# Patient Record
Sex: Female | Born: 1950 | Race: White | Hispanic: No | Marital: Married | Smoking: Never smoker
Health system: Southern US, Community
[De-identification: ages and names within clinical notes are randomized; demographics above are authoritative.]

## PROBLEM LIST (undated history)

## (undated) DIAGNOSIS — K5792 Diverticulitis of intestine, part unspecified, without perforation or abscess without bleeding: Secondary | ICD-10-CM

## (undated) DIAGNOSIS — M1990 Unspecified osteoarthritis, unspecified site: Secondary | ICD-10-CM

## (undated) DIAGNOSIS — F411 Generalized anxiety disorder: Secondary | ICD-10-CM

## (undated) DIAGNOSIS — D3611 Benign neoplasm of peripheral nerves and autonomic nervous system of face, head, and neck: Secondary | ICD-10-CM

## (undated) DIAGNOSIS — R51 Headache: Secondary | ICD-10-CM

## (undated) DIAGNOSIS — B019 Varicella without complication: Secondary | ICD-10-CM

## (undated) DIAGNOSIS — E785 Hyperlipidemia, unspecified: Secondary | ICD-10-CM

## (undated) DIAGNOSIS — M8580 Other specified disorders of bone density and structure, unspecified site: Secondary | ICD-10-CM

## (undated) DIAGNOSIS — I10 Essential (primary) hypertension: Secondary | ICD-10-CM

## (undated) DIAGNOSIS — L4050 Arthropathic psoriasis, unspecified: Secondary | ICD-10-CM

## (undated) HISTORY — DX: Other specified disorders of bone density and structure, unspecified site: M85.80

## (undated) HISTORY — DX: Arthropathic psoriasis, unspecified: L40.50

## (undated) HISTORY — PX: NEUROFIBROMA EXCISION: SHX2089

## (undated) HISTORY — DX: Essential (primary) hypertension: I10

## (undated) HISTORY — PX: OTHER SURGICAL HISTORY: SHX169

## (undated) HISTORY — DX: Headache: R51

## (undated) HISTORY — DX: Diverticulitis of intestine, part unspecified, without perforation or abscess without bleeding: K57.92

## (undated) HISTORY — DX: Benign neoplasm of peripheral nerves and autonomic nervous system of face, head, and neck: D36.11

## (undated) HISTORY — DX: Generalized anxiety disorder: F41.1

## (undated) HISTORY — DX: Unspecified osteoarthritis, unspecified site: M19.90

## (undated) HISTORY — DX: Varicella without complication: B01.9

## (undated) HISTORY — DX: Hyperlipidemia, unspecified: E78.5

---

## 1998-02-21 HISTORY — PX: BREAST BIOPSY: SHX20

## 2003-03-13 ENCOUNTER — Encounter (INDEPENDENT_AMBULATORY_CARE_PROVIDER_SITE_OTHER): Payer: Self-pay | Admitting: *Deleted

## 2003-03-13 ENCOUNTER — Encounter: Admission: RE | Admit: 2003-03-13 | Discharge: 2003-03-13 | Payer: Self-pay | Admitting: Surgery

## 2006-07-03 ENCOUNTER — Ambulatory Visit: Payer: Self-pay | Admitting: Internal Medicine

## 2006-07-14 ENCOUNTER — Ambulatory Visit: Payer: Self-pay | Admitting: Internal Medicine

## 2006-07-18 ENCOUNTER — Encounter: Payer: Self-pay | Admitting: Internal Medicine

## 2010-10-05 ENCOUNTER — Other Ambulatory Visit: Payer: Self-pay | Admitting: Dermatology

## 2011-11-10 LAB — HM DEXA SCAN

## 2011-12-08 ENCOUNTER — Other Ambulatory Visit: Payer: Self-pay | Admitting: Dermatology

## 2012-04-05 ENCOUNTER — Ambulatory Visit: Payer: Self-pay | Admitting: Family Medicine

## 2012-04-12 ENCOUNTER — Ambulatory Visit (INDEPENDENT_AMBULATORY_CARE_PROVIDER_SITE_OTHER): Payer: BC Managed Care – PPO | Admitting: Family Medicine

## 2012-04-12 ENCOUNTER — Encounter: Payer: Self-pay | Admitting: Family Medicine

## 2012-04-12 VITALS — BP 120/80 | HR 90 | Temp 98.3°F | Ht 66.0 in | Wt 173.0 lb

## 2012-04-12 DIAGNOSIS — M8580 Other specified disorders of bone density and structure, unspecified site: Secondary | ICD-10-CM

## 2012-04-12 DIAGNOSIS — Z7689 Persons encountering health services in other specified circumstances: Secondary | ICD-10-CM

## 2012-04-12 DIAGNOSIS — L4050 Arthropathic psoriasis, unspecified: Secondary | ICD-10-CM

## 2012-04-12 DIAGNOSIS — E785 Hyperlipidemia, unspecified: Secondary | ICD-10-CM

## 2012-04-12 DIAGNOSIS — F411 Generalized anxiety disorder: Secondary | ICD-10-CM

## 2012-04-12 DIAGNOSIS — I10 Essential (primary) hypertension: Secondary | ICD-10-CM

## 2012-04-12 DIAGNOSIS — Z7189 Other specified counseling: Secondary | ICD-10-CM

## 2012-04-12 DIAGNOSIS — M899 Disorder of bone, unspecified: Secondary | ICD-10-CM

## 2012-04-12 HISTORY — DX: Hyperlipidemia, unspecified: E78.5

## 2012-04-12 HISTORY — DX: Arthropathic psoriasis, unspecified: L40.50

## 2012-04-12 HISTORY — DX: Generalized anxiety disorder: F41.1

## 2012-04-12 HISTORY — DX: Essential (primary) hypertension: I10

## 2012-04-12 HISTORY — DX: Other specified disorders of bone density and structure, unspecified site: M85.80

## 2012-04-12 MED ORDER — SERTRALINE HCL 50 MG PO TABS: 50.0000 mg | ORAL_TABLET | Freq: Every day | ORAL | Status: DC

## 2012-04-12 NOTE — Progress Notes (Addendum)
Chief Complaint  Patient presents with  . Establish Care    HPI:  Cassidy Dickerson is here to establish care. Used to see [place] - she left private practice. Last PCP and physical:  Has the following chronic problems and concerns today:  Patient Active Problem List  Diagnosis  . Hyperlipemia  . Essential hypertension, benign  . Generalized anxiety disorder  . Psoriatic arthritis - managed by Dr. Dierdre Forth in Rheum  HLD: -reports was on simvastatin in the past -she stopped taking this accidentally - about 1 year ago -she wants to check labs and restart if needed  HTN: -thinks has been controlled with diet exercise  Palpitations: -very rare  -lasts a few seconds, usually with stress  GAD: -takes sertraline for this -stable, doing well -wants refill  Health Maintenance: -mammogram and dexa - both good per her report -sees Doctor Dierdre Forth for Psoriatic arthritis and Osteopenia and was on boniva for 5 years, but has been stopped and doing calcium and vitamin D, he checks labs -last pap was 3 years ago - normal, remote mild abnormalities with paps ROS: See pertinent positives and negatives per HPI.  Past Medical History  Diagnosis Date  . Hyperlipemia 04/12/2012  . Essential hypertension, benign 04/12/2012  . Generalized anxiety disorder 04/12/2012  . Psoriatic arthritis - managed by Dr. Dierdre Forth in Rheum 04/12/2012  . Osteopenia 04/12/2012  . Neurofibroma of neck     Family History  Problem Relation Age of Onset  . Heart disease Father 43    MI  . Cancer Maternal Grandmother     cervical ca    History   Social History  . Marital Status: Married    Spouse Name: N/A    Number of Children: N/A  . Years of Education: N/A   Social History Main Topics  . Smoking status: Never Smoker   . Smokeless tobacco: None  . Alcohol Use: Yes     Comment: SOCIALLY   . Drug Use: None  . Sexually Active: None   Other Topics Concern  . None   Social History  Narrative   Work or School: part Engineer, technical sales for [employer] Situation: lives with husband whom is dealing with health issues - ? parkinson's      Spiritual Beliefs: episcopalian, christian      Lifestyle:  No regular exercise, diet is decent                Current outpatient prescriptions:folic acid (FOLVITE) 1 MG tablet, Take 1 mg by mouth daily., Disp: , Rfl: ;  methotrexate (RHEUMATREX) 2.5 MG tablet, Take by mouth once a week. 4 tablets once per week, Disp: , Rfl: ;  sertraline (ZOLOFT) 50 MG tablet, Take 1 tablet (50 mg total) by mouth daily., Disp: 90 tablet, Rfl: 3  EXAM:  Filed Vitals:   04/12/12 0819  BP: 120/80  Pulse: 90  Temp: 98.3 F (36.8 C)    Body mass index is 27.94 kg/(m^2).  GENERAL: vitals reviewed and listed above, alert, oriented, appears well hydrated and in no acute distress  HEENT: atraumatic, conjunttiva clear, no obvious abnormalities on inspection of external nose and ears  NECK: no obvious masses on inspection  MS: moves all extremities without noticeable abnormality  PSYCH: pleasant and cooperative, no obvious depression or anxiety  ASSESSMENT AND PLAN:  Discussed the following assessment and plan:  Hyperlipemia - Plan: Lipid Panel  Essential hypertension, benign  Generalized anxiety disorder -  Plan: sertraline (ZOLOFT) 50 MG tablet  Psoriatic arthritis - managed by Dr. Dierdre Forth in Rheum  Osteopenia  Encounter to establish care - Plan: Hemoglobin A1c -We reviewed the PMH, PSH, FH, SH, Meds and Allergies. -We provided refills for any medications we will prescribe as needed. -We addressed current concerns per orders and patient instructions. -Discussed the palpitations and offered heart monitor - she is not worried about this as occurs so infrequently and thinks is anxiety - she will journal for now -We have asked for records for pertinent exams, studies, vaccines and notes from previous providers. -We have  advised patient to follow up per instructions below.  -Patient advised to return or notify a doctor immediately if symptoms worsen or persist or new concerns arise.  Patient Instructions  -We have ordered labs or studies at this visit. It can take up to 1-2 weeks for results and processing. We will contact you with instructions IF your results are abnormal. Normal results will be released to your [facility]. If you have not heard from [place] or can not find your results in [facility] in 2 weeks please contact our office.  -PLEASE SIGN UP FOR MYCHART TODAY   We recommend the following healthy lifestyle measures: - eat a healthy diet consisting of lots of vegetables, fruits, beans, nuts, seeds, healthy meats such as white chicken and fish and whole grains.  - avoid fried foods, fast food, processed foods, sodas, red meet and other fattening foods.  - get a least 150 minutes of aerobic exercise per week.   Follow up in: schedule lab appointment for tomorrow and physical exam in next few months      KIM, HANNAH R.

## 2012-04-12 NOTE — Patient Instructions (Addendum)
-  We have ordered labs or studies at this visit. It can take up to 1-2 weeks for results and processing. We will contact you with instructions IF your results are abnormal. Normal results will be released to your [facility] If you have not heard from [place] or can not find your results in [facility] in 2 weeks please contact our office.  -PLEASE SIGN UP FOR MYCHART TODAY   We recommend the following healthy lifestyle measures: - eat a healthy diet consisting of lots of vegetables, fruits, beans, nuts, seeds, healthy meats such as white chicken and fish and whole grains.  - avoid fried foods, fast food, processed foods, sodas, red meet and other fattening foods.  - get a least 150 minutes of aerobic exercise per week.   Follow up in: schedule lab appointment for tomorrow and physical exam in next few months

## 2012-04-13 ENCOUNTER — Other Ambulatory Visit (INDEPENDENT_AMBULATORY_CARE_PROVIDER_SITE_OTHER): Payer: BC Managed Care – PPO

## 2012-04-13 DIAGNOSIS — E785 Hyperlipidemia, unspecified: Secondary | ICD-10-CM

## 2012-04-13 DIAGNOSIS — Z7189 Other specified counseling: Secondary | ICD-10-CM

## 2012-04-13 DIAGNOSIS — Z7689 Persons encountering health services in other specified circumstances: Secondary | ICD-10-CM

## 2012-04-13 LAB — LIPID PANEL
Cholesterol: 264 mg/dL — ABNORMAL HIGH (ref 0–200)
HDL: 41.9 mg/dL (ref >39.00)
Total CHOL/HDL Ratio: 6
Triglycerides: 113 mg/dL (ref 0.0–149.0)
VLDL: 22.6 mg/dL (ref 0.0–40.0)

## 2012-04-13 LAB — LDL CHOLESTEROL, DIRECT: Direct LDL: 182.8 mg/dL

## 2012-04-15 ENCOUNTER — Telehealth: Payer: Self-pay | Admitting: Family Medicine

## 2012-04-15 NOTE — Telephone Encounter (Signed)
Please let patient know, since not yet signed up for mychart, wanted to contact regarding lab results. Recommend signing up for mychart to see these results in about 10 days.  -cholesterol is high  The best treatment to hopefully reverse these findings and prevent adverse health outcomes is a healthy diet and regular exercise. I know she was on a statin in the past - if she prefers to restart this we can. Or she can work on diet and exercise and we can talk about it at her next appointment.

## 2012-04-16 NOTE — Telephone Encounter (Signed)
Left a message for pt to return call 

## 2012-04-18 ENCOUNTER — Encounter: Payer: Self-pay | Admitting: Family Medicine

## 2012-04-18 NOTE — Telephone Encounter (Signed)
Left a message for pt to return call 

## 2012-04-19 NOTE — Telephone Encounter (Signed)
Pt states she would like to try to work on diet and exercise first before starting back on statin drug.

## 2012-04-19 NOTE — Telephone Encounter (Signed)
Pt returned call; spoke with pt about lab results and pt is aware.

## 2012-04-19 NOTE — Telephone Encounter (Signed)
Left a message for pt to return call 

## 2012-04-19 NOTE — Telephone Encounter (Signed)
Letter mailed to pts home address.  

## 2012-05-01 ENCOUNTER — Encounter: Payer: Self-pay | Admitting: Family Medicine

## 2012-05-09 ENCOUNTER — Encounter: Payer: Self-pay | Admitting: Family Medicine

## 2012-05-09 NOTE — Progress Notes (Signed)
Received notes from Dr. Dierdre Forth. Scanned into chart. Had dexa 03-2011.

## 2012-05-14 ENCOUNTER — Encounter: Payer: Self-pay | Admitting: Family Medicine

## 2012-06-14 ENCOUNTER — Ambulatory Visit (INDEPENDENT_AMBULATORY_CARE_PROVIDER_SITE_OTHER): Payer: BC Managed Care – PPO | Admitting: Family Medicine

## 2012-06-14 ENCOUNTER — Encounter: Payer: Self-pay | Admitting: Family Medicine

## 2012-06-14 ENCOUNTER — Other Ambulatory Visit (HOSPITAL_COMMUNITY)
Admission: RE | Admit: 2012-06-14 | Discharge: 2012-06-14 | Disposition: A | Discharge status: Home / Self Care | Payer: BC Managed Care – PPO | Source: Ambulatory Visit | Attending: Family Medicine | Admitting: Family Medicine

## 2012-06-14 VITALS — BP 140/90 | Temp 98.3°F | Wt 173.0 lb

## 2012-06-14 DIAGNOSIS — I10 Essential (primary) hypertension: Secondary | ICD-10-CM

## 2012-06-14 DIAGNOSIS — Z23 Encounter for immunization: Secondary | ICD-10-CM

## 2012-06-14 DIAGNOSIS — Z0000 Encounter for general adult medical examination without abnormal findings: Secondary | ICD-10-CM

## 2012-06-14 DIAGNOSIS — E785 Hyperlipidemia, unspecified: Secondary | ICD-10-CM

## 2012-06-14 DIAGNOSIS — Z01419 Encounter for gynecological examination (general) (routine) without abnormal findings: Secondary | ICD-10-CM | POA: Insufficient documentation

## 2012-06-14 MED ORDER — PRAVASTATIN SODIUM 20 MG PO TABS: 20.0000 mg | ORAL_TABLET | Freq: Every day | ORAL | Status: DC

## 2012-06-14 NOTE — Patient Instructions (Signed)
-  We have ordered labs or studies at this visit (pap smear). It can take up to 1-2 weeks for results and processing. We will contact you with instructions IF your results are abnormal. Normal results will be released to your [facility]. If you have not heard from [place] or can not find your results in [facility] in 2 weeks please contact our office.  -PLEASE SIGN UP FOR MYCHART TODAY   We recommend the following healthy lifestyle measures: - eat a healthy diet consisting of lots of vegetables, fruits, beans, nuts, seeds, healthy meats such as white chicken and fish and whole grains.  - avoid fried foods, fast food, processed foods, sodas, red meet and other fattening foods.  - get a least 150 minutes of aerobic exercise per week.   -As we discussed, we have prescribed a new medication (PRAVASTAIN) for you at this appointment. We discussed the common and serious potential adverse effects of this medication and you can review these and more with the pharmacist when you pick up your medication.  Please follow the instructions for use carefully and notify us immediately if you have any problems taking this medication.  -schedule you mammogram for September  -Wear sunscreen  Follow up in: 3 months, morning appointment - come fasting

## 2012-06-14 NOTE — Addendum Note (Signed)
Addended by: Terressa Koyanagi on: 06/14/2012 11:52 AM   Modules accepted: Level of Service

## 2012-06-14 NOTE — Progress Notes (Addendum)
Chief Complaint  Patient presents with  . Annual Exam    HPI:  Here for CPE:  -Concerns today: none  -Diet: ok, trying to eat healthier  -Exercise: started walking 30-60 minutes 3-4 days per weeks  -Diabetes and Dyslipidemia Screening: checked 03/2012 - lipids high, advised restarting statin but she wanted to try lifestyle changes first. But, now she wants to start the statin as brother recently had heart issues.  -Hx of HTN: no  -Vaccines: she wants to do shingles vaccine today  -pap history: last pap 3 years ago normal - reports mild remote pap abnormalities > 8 years ago and normal on repeat  -FDLMP: postmenopausal  -sexual activity: yes, female partner, no new partners  -wants STI testing: no  -FH breast, colon or ovarian ca: see FH  -dexa followed by Dr. Dierdre Forth in Rheum - most recent in 2013, on boniva for 5 years, now on calcium and vit D -mammo 10/2011 birads 2 -colon cancer screening: 3 years ago per patient report  -Alcohol, Tobacco, drug use: see social history  Sees dermatology once yearly for skin checks  Review of Systems - denies: CP, SOB, fevers, weight changes, bowel changes, dysuria, vaginal discharge, hematochezia or melena, pain, rashes, dizziness, vision changes  Past Medical History  Diagnosis Date  . Hyperlipemia 04/12/2012  . Essential hypertension, benign 04/12/2012  . Generalized anxiety disorder 04/12/2012  . Psoriatic arthritis - managed by Dr. Dierdre Forth in Rheum 04/12/2012  . Osteopenia 04/12/2012  . Neurofibroma of neck   . Arthritis   . Chicken pox   . Headache     frequent    Family History  Problem Relation Age of Onset  . Heart disease Father 1    MI  . Cancer Maternal Grandmother     cervical ca  . Heart disease Brother 31    History   Social History  . Marital Status: Married    Spouse Name: N/A    Number of Children: N/A  . Years of Education: N/A   Social History Main Topics  . Smoking status: Never Smoker   .  Smokeless tobacco: None  . Alcohol Use: Yes     Comment: SOCIALLY   . Drug Use: None  . Sexually Active: None   Other Topics Concern  . 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 parkinson's      Spiritual Beliefs: episcopalian, christian      Lifestyle:  No regular exercise, diet is decent                Current outpatient prescriptions:folic acid (FOLVITE) 1 MG tablet, Take 1 mg by mouth daily., Disp: , Rfl: ;  methotrexate (RHEUMATREX) 2.5 MG tablet, Take by mouth once a week. 4 tablets once per week, Disp: , Rfl: ;  sertraline (ZOLOFT) 50 MG tablet, Take 1 tablet (50 mg total) by mouth daily., Disp: 90 tablet, Rfl: 3;  pravastatin (PRAVACHOL) 20 MG tablet, Take 1 tablet (20 mg total) by mouth daily., Disp: 90 tablet, Rfl: 3  EXAM:  Filed Vitals:   06/14/12 1118  BP: 140/90  Temp: 98.3 F (36.8 C)    GENERAL: vitals reviewed and listed below, alert, oriented, appears well hydrated and in no acute distress  HEENT: head atraumatic, PERRLA, normal appearance of eyes, ears, nose and mouth. moist mucus membranes.  NECK: supple, no masses or lymphadenopathy  LUNGS: clear to auscultation bilaterally, no rales, rhonchi or wheeze  CV: HRRR, no peripheral edema or cyanosis, normal pedal pulses  BREAST: normal appearance - no lesions or discharge, on palpation normal breast tissue without any suspicious masses  ABDOMEN: bowel sounds normal, soft, non tender to palpation, no masses, no rebound or guarding  GU: normal appearance of external genitalia - no lesions or masses, normal vaginal mucosa - no abnormal discharge, normal appearance of cervix - no lesions or abnormal discharge, no masses or tenderness on palpation of uterus and ovaries. Pap obtained.  RECTAL: refused  SKIN: no rash or abnormal lesions  MS: normal gait, moves all extremities  normally  NEURO: CN II-XII grossly intact, normal muscle strength and sensation to light touch on extremities  PSYCH: normal affect, pleasant and cooperative  ASSESSMENT AND PLAN:  Discussed the following assessment and plan:  Routine general medical examination at a health care facility - Plan: Cytology - PAP  Hyperlipemia - Plan: pravastatin (PRAVACHOL) 20 MG tablet  Essential hypertension, benign  -62 yo F  -Discussed and advised all [place] preventive services health task force level A and B recommendations for age, sex and risks.  -Advised at least 150 minutes of exercise per week and a healthy diet low in saturated fats and sweets and consisting of fresh fruits and vegetables, lean meats such as fish and white chicken and whole grains.  -pap today  -shingles vaccine today  -starting statin today per her request after discussion risks benefits - follow up in 3 months  -discussed mildly elevated BP and advised regular exercise, low sodium healthy diet - will follow  -mammo in September, self breast exams  -labs, studies and vaccines per orders this encounter  No orders of the defined types were placed in this encounter.    Patient Instructions  -We have ordered labs or studies at this visit (pap smear). It can take up to 1-2 weeks for results and processing. We will contact you with instructions IF your results are abnormal. Normal results will be released to your [facility] If you have not heard from [place] or can not find your results in [facility] in 2 weeks please contact our office.  -PLEASE SIGN UP FOR MYCHART TODAY   We recommend the following healthy lifestyle measures: - eat a healthy diet consisting of lots of vegetables, fruits, beans, nuts, seeds, healthy meats such as white chicken and fish and whole grains.  - avoid fried foods, fast food, processed foods, sodas, red meet and other fattening foods.  - get a least 150 minutes of aerobic exercise per week.   -As we  discussed, we have prescribed a new medication (PRAVASTAIN) for you at this appointment. We discussed the common and serious potential adverse effects of this medication and you can review these and more with the pharmacist when you pick up your medication.  Please follow the instructions for use carefully and notify us immediately if you have any problems taking this medication.  -schedule you mammogram for September  -Wear sunscreen  Follow up in: 3 months, morning appointment - come fasting     Patient advised to return to clinic immediately if symptoms worsen or persist or new concerns.   Return in about 3 months (around 09/13/2012) for follow up HLD.  KIM, HANNAH R.

## 2012-06-14 NOTE — Addendum Note (Signed)
Addended by: Azucena Freed on: 06/14/2012 01:07 PM   Modules accepted: Orders

## 2012-06-18 ENCOUNTER — Telehealth: Payer: Self-pay | Admitting: Family Medicine

## 2012-06-18 NOTE — Telephone Encounter (Signed)
Pap is normal

## 2012-06-19 NOTE — Telephone Encounter (Signed)
Called and spoke with pt and pt is aware.  

## 2012-07-18 ENCOUNTER — Other Ambulatory Visit: Payer: Self-pay | Admitting: Dermatology

## 2013-01-03 ENCOUNTER — Encounter: Payer: Self-pay | Admitting: Family Medicine

## 2013-01-03 NOTE — Progress Notes (Signed)
Received notes from [facility] from visit on 01/02/13.  Pt to follow up in 6 months.  Notes given to Dr. Selena Batten for initials and then to scan.

## 2013-05-17 ENCOUNTER — Other Ambulatory Visit: Payer: Self-pay | Admitting: Family Medicine

## 2013-05-17 NOTE — Telephone Encounter (Signed)
Left a message for pt to schedule follow up visit.  Rx sent to pharmacy for 90 days.

## 2013-07-23 ENCOUNTER — Other Ambulatory Visit: Payer: Self-pay | Admitting: Family Medicine

## 2013-09-16 ENCOUNTER — Ambulatory Visit (INDEPENDENT_AMBULATORY_CARE_PROVIDER_SITE_OTHER): Payer: BC Managed Care – PPO | Admitting: Family Medicine

## 2013-09-16 ENCOUNTER — Encounter: Payer: Self-pay | Admitting: Family Medicine

## 2013-09-16 VITALS — BP 130/92 | HR 79 | Temp 98.5°F | Ht 66.0 in | Wt 177.0 lb

## 2013-09-16 DIAGNOSIS — I10 Essential (primary) hypertension: Secondary | ICD-10-CM

## 2013-09-16 DIAGNOSIS — L4050 Arthropathic psoriasis, unspecified: Secondary | ICD-10-CM

## 2013-09-16 DIAGNOSIS — E785 Hyperlipidemia, unspecified: Secondary | ICD-10-CM

## 2013-09-16 DIAGNOSIS — F411 Generalized anxiety disorder: Secondary | ICD-10-CM

## 2013-09-16 DIAGNOSIS — K5732 Diverticulitis of large intestine without perforation or abscess without bleeding: Secondary | ICD-10-CM

## 2013-09-16 MED ORDER — SERTRALINE HCL 50 MG PO TABS: 50.0000 mg | ORAL_TABLET | Freq: Every day | ORAL | Status: DC

## 2013-09-16 MED ORDER — PRAVASTATIN SODIUM 20 MG PO TABS: 20.0000 mg | ORAL_TABLET | Freq: Every day | ORAL | Status: DC

## 2013-09-16 MED ORDER — METRONIDAZOLE 500 MG PO TABS: 500.0000 mg | ORAL_TABLET | Freq: Three times a day (TID) | ORAL | Status: DC

## 2013-09-16 MED ORDER — CIPROFLOXACIN HCL 500 MG PO TABS: 500.0000 mg | ORAL_TABLET | Freq: Two times a day (BID) | ORAL | Status: DC

## 2013-09-16 NOTE — Progress Notes (Signed)
Pre visit review using our clinic review tool, if applicable. No additional management support is needed unless otherwise documented below in the visit note. 

## 2013-09-16 NOTE — Patient Instructions (Signed)
Diverticulitis Diverticulitis is inflammation or infection of small pouches in your colon that form when you have a condition called diverticulosis. The pouches in your colon are called diverticula. Your colon, or large intestine, is where water is absorbed and stool is formed. Complications of diverticulitis can include:  Bleeding.  Severe infection.  Severe pain.  Perforation of your colon.  Obstruction of your colon. CAUSES  Diverticulitis is caused by bacteria. Diverticulitis happens when stool becomes trapped in diverticula. This allows bacteria to grow in the diverticula, which can lead to inflammation and infection. RISK FACTORS People with diverticulosis are at risk for diverticulitis. Eating a diet that does not include enough fiber from fruits and vegetables may make diverticulitis more likely to develop. SYMPTOMS  Symptoms of diverticulitis may include:  Abdominal pain and tenderness. The pain is normally located on the left side of the abdomen, but may occur in other areas.  Fever and chills.  Bloating.  Cramping.  Nausea.  Vomiting.  Constipation.  Diarrhea.  Blood in your stool. DIAGNOSIS  Your health care provider will ask you about your medical history and do a physical exam. You may need to have tests done because many medical conditions can cause the same symptoms as diverticulitis. Tests may include:  Blood tests.  Urine tests.  Imaging tests of the abdomen, including X-rays and CT scans. When your condition is under control, your health care provider may recommend that you have a colonoscopy. A colonoscopy can show how severe your diverticula are and whether something else is causing your symptoms. TREATMENT  Most cases of diverticulitis are mild and can be treated at home. Treatment may include:  Taking over-the-counter pain medicines.  Following a clear liquid diet.  Taking antibiotic medicines by mouth for 7-10 days. More severe cases may  be treated at a hospital. Treatment may include:  Not eating or drinking.  Taking prescription pain medicine.  Receiving antibiotic medicines through an IV tube.  Receiving fluids and nutrition through an IV tube.  Surgery. HOME CARE INSTRUCTIONS   Follow your health care provider's instructions carefully.  Follow a full liquid diet or other diet as directed by your health care provider. After your symptoms improve, your health care provider may tell you to change your diet. He or she may recommend you eat a high-fiber diet. Fruits and vegetables are good sources of fiber. Fiber makes it easier to pass stool.  Take fiber supplements or probiotics as directed by your health care provider.  Only take medicines as directed by your health care provider.  Keep all your follow-up appointments. SEEK MEDICAL CARE IF:   Your pain does not improve.  You have a hard time eating food.  Your bowel movements do not return to normal. SEEK IMMEDIATE MEDICAL CARE IF:   Your pain becomes worse.  Your symptoms do not get better.  Your symptoms suddenly get worse.  You have a fever.  You have repeated vomiting.  You have bloody or black, tarry stools. MAKE SURE YOU:   Understand these instructions.  Will watch your condition.  Will get help right away if you are not doing well or get worse. Document Released: 11/17/2004 Document Revised: 02/12/2013 Document Reviewed: 01/02/2013 [facility] Patient Information 2015 [place], [place]. This information is not intended to replace advice given to you by your health care provider. Make sure you discuss any questions you have with your health care provider.  Follow up in: 1 month for CPE and labs

## 2013-09-16 NOTE — Progress Notes (Signed)
No chief complaint on file.   HPI:  Follow up:  HLD: -started statin last visit 05/2012 -reports: tolerating well -denies: leg cramps  Depression/GAD: -on zoloft 50mg  daily -reports: stable -denies: depressed, thoughts of self harm  Psoriatic arthritis: -managed by Dr. Amil Amen, [facility]  LLQ Pain: -hx diverticulitis in June 2014 treated with cipro and flagyl outpt- in [place] had CT scan -has has similar symptoms the last week or two -last colonoscopy, she can not remember, thinks in last ten years -denies: fevers, severe pain, dysuria, vomiting, diarrhea, hematochezia, melena -called for records from this ER visit - reviewed and scanned in  HTN: -hx of HTN, never on medication for this -denies: CP, SOB, swelling, palpitations   ROS: See pertinent positives and negatives per HPI.  Past Medical History  Diagnosis Date  . Hyperlipemia 04/12/2012  . Essential hypertension, benign 04/12/2012  . Generalized anxiety disorder 04/12/2012  . Psoriatic arthritis - managed by Dr. Amil Amen in [facility] 04/12/2012  . Osteopenia 04/12/2012  . Neurofibroma of neck   . Arthritis   . Chicken pox   . Headache(784.0)     frequent    Past Surgical History  Procedure Laterality Date  . Breast biopsy  2000  . Endometrosis    . Nero bibromo      Family History  Problem Relation Age of Onset  . Heart disease Father 81    MI  . Cancer Maternal Grandmother     cervical ca  . Heart disease Brother 64    History   Social History  . Marital Status: Married    Spouse Name: N/A    Number of Children: N/A  . Years of Education: N/A   Social History Main Topics  . Smoking status: Never Smoker   . Smokeless tobacco: None  . Alcohol Use: Yes     Comment: SOCIALLY   . Drug Use: None  . Sexual Activity: None   Other Topics Concern  . 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 parkinson's      Spiritual Beliefs: episcopalian, christian      Lifestyle:  No regular exercise, diet is decent                Current outpatient prescriptions:folic acid (FOLVITE) 1 MG tablet, Take 1 mg by mouth daily., Disp: , Rfl: ;  methotrexate (RHEUMATREX) 2.5 MG tablet, Take by mouth once a week. 4 tablets once per week, Disp: , Rfl: ;  pravastatin (PRAVACHOL) 20 MG tablet, Take 1 tablet (20 mg total) by mouth daily., Disp: 90 tablet, Rfl: 3;  sertraline (ZOLOFT) 50 MG tablet, Take 1 tablet (50 mg total) by mouth daily., Disp: 90 tablet, Rfl: 3 ciprofloxacin (CIPRO) 500 MG tablet, Take 1 tablet (500 mg total) by mouth 2 (two) times daily., Disp: 20 tablet, Rfl: 0;  metroNIDAZOLE (FLAGYL) 500 MG tablet, Take 1 tablet (500 mg total) by mouth 3 (three) times daily., Disp: 21 tablet, Rfl: 0  EXAM:  Filed Vitals:   09/16/13 0903  BP: 130/92  Pulse: 79  Temp: 98.5 F (36.9 C)    Body mass index is 28.58 kg/(m^2).  GENERAL: vitals reviewed and listed above, alert, oriented, appears well hydrated and in no acute distress  HEENT: atraumatic, conjunttiva clear, no obvious abnormalities on inspection of external nose and ears  NECK: no obvious masses on inspection  LUNGS: clear to auscultation bilaterally, no wheezes, rales or rhonchi, good air movement  CV: HRRR, no peripheral edema  ABD: BS+, soft, mild LLQ TTP, no rebound or guarding  MS: moves all extremities without noticeable abnormality  PSYCH: pleasant and cooperative, no obvious depression or anxiety  ASSESSMENT AND PLAN:  Discussed the following assessment and plan:  Diverticulitis of large intestine without perforation or abscess without bleeding - Plan: ciprofloxacin (CIPRO) 500 MG tablet, metroNIDAZOLE (FLAGYL) 500 MG tablet -reviewed records from prior diverticulitis flare, discussed repeating CT versus tx empirically for likely diverticulitis with mild symptoms amendable to  outpt tx - she opted for empiric tx after discussion risks,  She is to call if any worsening or if not improving  Hyperlipemia - Plan: pravastatin (PRAVACHOL) 20 MG tablet  Essential hypertension, benign -better on recheck, follow up recheck in 1 month  Generalized anxiety disorder - Plan: sertraline (ZOLOFT) 50 MG tablet  Psoriatic arthritis - managed by Dr. Amil Amen in [facility]  CPE and follow up in 1 month or sooner if needed  -Patient advised to return or notify a doctor immediately if symptoms worsen or persist or new concerns arise.  Patient Instructions  Diverticulitis Diverticulitis is inflammation or infection of small pouches in your colon that form when you have a condition called diverticulosis. The pouches in your colon are called diverticula. Your colon, or large intestine, is where water is absorbed and stool is formed. Complications of diverticulitis can include:  Bleeding.  Severe infection.  Severe pain.  Perforation of your colon.  Obstruction of your colon. CAUSES  Diverticulitis is caused by bacteria. Diverticulitis happens when stool becomes trapped in diverticula. This allows bacteria to grow in the diverticula, which can lead to inflammation and infection. RISK FACTORS People with diverticulosis are at risk for diverticulitis. Eating a diet that does not include enough fiber from fruits and vegetables may make diverticulitis more likely to develop. SYMPTOMS  Symptoms of diverticulitis may include:  Abdominal pain and tenderness. The pain is normally located on the left side of the abdomen, but may occur in other areas.  Fever and chills.  Bloating.  Cramping.  Nausea.  Vomiting.  Constipation.  Diarrhea.  Blood in your stool. DIAGNOSIS  Your health care provider will ask you about your medical history and do a physical exam. You may need to have tests done because many medical conditions can cause the same symptoms as diverticulitis. Tests  may include:  Blood tests.  Urine tests.  Imaging tests of the abdomen, including X-rays and CT scans. When your condition is under control, your health care provider may recommend that you have a colonoscopy. A colonoscopy can show how severe your diverticula are and whether something else is causing your symptoms. TREATMENT  Most cases of diverticulitis are mild and can be treated at home. Treatment may include:  Taking over-the-counter pain medicines.  Following a clear liquid diet.  Taking antibiotic medicines by mouth for 7-10 days. More severe cases may be treated at a hospital. Treatment may include:  Not eating or drinking.  Taking prescription pain medicine.  Receiving antibiotic medicines through an IV tube.  Receiving fluids and nutrition through an IV tube.  Surgery. HOME CARE INSTRUCTIONS   Follow your health care provider's instructions carefully.  Follow a full liquid diet or other diet as directed by your health care provider. After your symptoms improve, your health care provider may tell you to change your diet.  He or she may recommend you eat a high-fiber diet. Fruits and vegetables are good sources of fiber. Fiber makes it easier to pass stool.  Take fiber supplements or probiotics as directed by your health care provider.  Only take medicines as directed by your health care provider.  Keep all your follow-up appointments. SEEK MEDICAL CARE IF:   Your pain does not improve.  You have a hard time eating food.  Your bowel movements do not return to normal. SEEK IMMEDIATE MEDICAL CARE IF:   Your pain becomes worse.  Your symptoms do not get better.  Your symptoms suddenly get worse.  You have a fever.  You have repeated vomiting.  You have bloody or black, tarry stools. MAKE SURE YOU:   Understand these instructions.  Will watch your condition.  Will get help right away if you are not doing well or get worse. Document Released:  11/17/2004 Document Revised: 02/12/2013 Document Reviewed: 01/02/2013 [facility] Patient Information 2015 [place], [place]. This information is not intended to replace advice given to you by your health care provider. Make sure you discuss any questions you have with your health care provider.  Follow up in: 1 month for CPE and labs      Junie Engram, [facility] R.

## 2013-09-17 ENCOUNTER — Encounter: Payer: Self-pay | Admitting: Family Medicine

## 2013-10-15 ENCOUNTER — Encounter: Payer: Self-pay | Admitting: Family Medicine

## 2013-10-17 ENCOUNTER — Encounter: Payer: BC Managed Care – PPO | Admitting: Family Medicine

## 2013-10-21 ENCOUNTER — Encounter: Payer: Self-pay | Admitting: Family Medicine

## 2013-10-21 ENCOUNTER — Ambulatory Visit (INDEPENDENT_AMBULATORY_CARE_PROVIDER_SITE_OTHER): Payer: BC Managed Care – PPO | Admitting: Family Medicine

## 2013-10-21 VITALS — BP 122/84 | HR 88 | Temp 98.1°F | Ht 66.0 in | Wt 176.5 lb

## 2013-10-21 DIAGNOSIS — K5900 Constipation, unspecified: Secondary | ICD-10-CM

## 2013-10-21 DIAGNOSIS — Z0000 Encounter for general adult medical examination without abnormal findings: Secondary | ICD-10-CM

## 2013-10-21 DIAGNOSIS — R1032 Left lower quadrant pain: Secondary | ICD-10-CM

## 2013-10-21 DIAGNOSIS — E785 Hyperlipidemia, unspecified: Secondary | ICD-10-CM

## 2013-10-21 DIAGNOSIS — Z8719 Personal history of other diseases of the digestive system: Secondary | ICD-10-CM

## 2013-10-21 DIAGNOSIS — I10 Essential (primary) hypertension: Secondary | ICD-10-CM

## 2013-10-21 DIAGNOSIS — F411 Generalized anxiety disorder: Secondary | ICD-10-CM

## 2013-10-21 DIAGNOSIS — L4050 Arthropathic psoriasis, unspecified: Secondary | ICD-10-CM

## 2013-10-21 LAB — LIPID PANEL
Cholesterol: 252 mg/dL — ABNORMAL HIGH (ref 0–200)
HDL: 41.1 mg/dL (ref >39.00)
NONHDL: 210.9
Total CHOL/HDL Ratio: 6
Triglycerides: 237 mg/dL — ABNORMAL HIGH (ref 0.0–149.0)
VLDL: 47.4 mg/dL — ABNORMAL HIGH (ref 0.0–40.0)

## 2013-10-21 LAB — BASIC METABOLIC PANEL
BUN: 16 mg/dL (ref 6–23)
CO2: 28 meq/L (ref 19–32)
Calcium: 9.3 mg/dL (ref 8.4–10.5)
Chloride: 104 mEq/L (ref 96–112)
Creatinine, Ser: 0.7 mg/dL (ref 0.4–1.2)
GFR: 97.25 mL/min (ref >60.00)
Glucose, Bld: 91 mg/dL (ref 70–99)
Potassium: 4.1 mEq/L (ref 3.5–5.1)
Sodium: 141 mEq/L (ref 135–145)

## 2013-10-21 LAB — LDL CHOLESTEROL, DIRECT: Direct LDL: 193.5 mg/dL

## 2013-10-21 NOTE — Progress Notes (Signed)
Pre visit review using our clinic review tool, if applicable. No additional management support is needed unless otherwise documented below in the visit note. 

## 2013-10-21 NOTE — Progress Notes (Signed)
No chief complaint on file.   HPI:  Here for CPE:  -Concerns and/or follow up today:   HLD:  -started statin 05/2012  -reports: tolerating well  -denies: leg cramps, cognitive changes  Depression/GAD:  -on zoloft 50mg  daily  -reports: stable  -denies: depressed, thoughts of self harm   Psoriatic arthritis:  -managed by Dr. Amil Amen, [facility]   LLQ Pain:  -hx diverticulitis in June 2014 treated with cipro and flagyl outpt- in [place] had CT scan  -recurrence 09/16/13 - tx with cipro and flagyl -much better, occ constipation and sometimes has brief cramps with this that can be L or R sided  HTN:  -hx of HTN, never on medication for this -elevated last visit - wanted to monitor and recheck today, she sometimes checks at the drug store and is ok too -denies: CP, SOB, swelling, palpitations  -she had yearly skin check recently with her dermatologist  -Diet: variety of foods, balance and well rounded, larger portion sizes  -Exercise: no regular exercise  -Taking folic acid, vitamin D or calcium: no  -Diabetes and Dyslipidemia Screening: doing today  -Hx of HTN: yes, mild  -Vaccines: influenza offered - she is going to get this at school  -pap history: 05/2012 - normal  -FDLMP: n/a   -sexual activity: yes, female partner, no new partners  -wants STI testing: no  -FH breast, colon or ovarian ca: see FH Last mammogram:08/2013 Last colon cancer screening:2011  Breast Ca Risk Assessment: -no FH in immediate family members, remote cyst removal in [place], UTD on mammograms, no concerns with breasts, does self breast exams  -gets dexa with rheum do to hx steroid use  -Alcohol, Tobacco, drug use: see social history  Review of Systems - no fevers, unintentional weight loss, vision loss, hearing loss, chest pain, sob, hemoptysis, melena, hematochezia, hematuria, genital discharge, changing or concerning skin lesions, bleeding, bruising, loc, thoughts of self harm or  SI  Past Medical History  Diagnosis Date  . Hyperlipemia 04/12/2012  . Essential hypertension, benign 04/12/2012  . Generalized anxiety disorder 04/12/2012  . Psoriatic arthritis - managed by Dr. Amil Amen in [facility] 04/12/2012  . Osteopenia 04/12/2012  . Neurofibroma of neck   . Arthritis   . Chicken pox   . Headache(784.0)     frequent    Past Surgical History  Procedure Laterality Date  . Breast biopsy  2000  . Endometrosis    . Nero bibromo      Family History  Problem Relation Age of Onset  . Heart disease Father 101    MI  . Cancer Maternal Grandmother     cervical ca  . Heart disease Brother 35    History   Social History  . Marital Status: Married    Spouse Name: N/A    Number of Children: N/A  . Years of Education: N/A   Social History Main Topics  . Smoking status: Never Smoker   . Smokeless tobacco: None  . Alcohol Use: Yes     Comment: SOCIALLY   . Drug Use: None  . Sexual Activity: None   Other Topics Concern  . 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 parkinson's      Spiritual Beliefs: episcopalian, christian      Lifestyle:  No regular exercise, diet is decent  Current outpatient prescriptions:folic acid (FOLVITE) 1 MG tablet, Take 1 mg by mouth daily., Disp: , Rfl: ;  methotrexate (RHEUMATREX) 2.5 MG tablet, Take by mouth once a week. 4 tablets once per week, Disp: , Rfl: ;  pravastatin (PRAVACHOL) 20 MG tablet, Take 1 tablet (20 mg total) by mouth daily., Disp: 90 tablet, Rfl: 3;  sertraline (ZOLOFT) 50 MG tablet, Take 1 tablet (50 mg total) by mouth daily., Disp: 90 tablet, Rfl: 3  EXAM:  Filed Vitals:   10/21/13 0821  BP: 122/84  Pulse: 88  Temp: 98.1 F (36.7 C)    GENERAL: vitals reviewed and listed below, alert, oriented, appears well hydrated and in no acute distress  HEENT: head  atraumatic, PERRLA, normal appearance of eyes, ears, nose and mouth. moist mucus membranes.  NECK: supple, no masses or lymphadenopathy  LUNGS: clear to auscultation bilaterally, no rales, rhonchi or wheeze  CV: HRRR, no peripheral edema or cyanosis, normal pedal pulses  BREAST: declined  ABDOMEN: bowel sounds normal, soft, non tender to palpation, no masses, no rebound or guarding - minimal tenderness in the LLQ  GU: declined  RECTAL: refused  SKIN: no rash or abnormal lesions  MS: normal gait, moves all extremities normally  NEURO: CN II-XII grossly intact, normal muscle strength and sensation to light touch on extremities  PSYCH: normal affect, pleasant and cooperative  ASSESSMENT AND PLAN:  Discussed the following assessment and plan:  1. Encounter for preventive health examination -Discussed and advised all [place] preventive services health task force level A and B recommendations for age, sex and risks.  -Advised at least 150 minutes of exercise per week and a healthy diet low in saturated fats and sweets and consisting of fresh fruits and vegetables, lean meats such as fish and white chicken and whole grains.  -FASTING labs, studies and vaccines per orders this encounter  2. Hyperlipemia - Lipid panel  3. Essential hypertension, benign - Basic metabolic panel  4. Psoriatic arthritis - managed by Dr. Amil Amen in [facility] -gets labs here with [facility] on regular basis, asked her to have them fax to [place]  5. Unspecified constipation -tx fiber supplement daily, mirilax prn - Ambulatory referral to Gastroenterology  6. LLQ pain -intermittent and brief and RLQ too at times, sounds like related to constipation, however has hx recurrent diverticulitis and wants to see GI for eval, referral placed, return precuations - Ambulatory referral to Gastroenterology  7. History of diverticulitis - Ambulatory referral to Gastroenterology  8.Depression and anxiety: -stable  Orders  Placed This Encounter  Procedures  . Lipid panel  . Basic metabolic panel  . Ambulatory referral to Gastroenterology    Referral Priority:  Routine    Referral Type:  Consultation    Referral Reason:  Specialty Services Required    Requested Specialty:  Gastroenterology    Number of Visits Requested:  1    Patient advised to return to clinic immediately if symptoms worsen or persist or new concerns.  Patient Instructions  -We have ordered labs or studies at this visit. It can take up to 1-2 weeks for results and processing. We will contact you with instructions IF your results are abnormal. Normal results will be released to your [facility]. If you have not heard from [place] or can not find your results in [facility] in 2 weeks please contact our office.  We recommend the following healthy lifestyle measures: - eat a healthy diet consisting of lots of vegetables, fruits, beans, nuts, seeds, healthy meats such  as white chicken and fish and whole grains.  - avoid fried foods, fast food, processed foods, sodas, red meet and other fattening foods.  - get a least 150 minutes of aerobic exercise per week.   Fiber supplement daily, mirilax if needed per instructions, referred to GI per her request - seek care immediately if any worsening or change  Follow up in:  4-6 months     Return in about 6 months (around 04/21/2014) for folow up.  Colin Benton R.

## 2013-10-21 NOTE — Patient Instructions (Signed)
-  We have ordered labs or studies at this visit. It can take up to 1-2 weeks for results and processing. We will contact you with instructions IF your results are abnormal. Normal results will be released to your [facility]. If you have not heard from [place] or can not find your results in [facility] in 2 weeks please contact our office.  We recommend the following healthy lifestyle measures: - eat a healthy diet consisting of lots of vegetables, fruits, beans, nuts, seeds, healthy meats such as white chicken and fish and whole grains.  - avoid fried foods, fast food, processed foods, sodas, red meet and other fattening foods.  - get a least 150 minutes of aerobic exercise per week.   Fiber supplement daily, mirilax if needed per instructions, referred to GI per her request - seek care immediately if any worsening or change  Follow up in:  4-6 months

## 2013-10-22 ENCOUNTER — Encounter: Payer: Self-pay | Admitting: Internal Medicine

## 2013-10-22 MED ORDER — ROSUVASTATIN CALCIUM 20 MG PO TABS: 20.0000 mg | ORAL_TABLET | Freq: Every day | ORAL | Status: AC

## 2013-10-22 NOTE — Addendum Note (Signed)
Addended by: Agnes Lawrence on: 10/22/2013 02:07 PM   Modules accepted: Orders

## 2013-12-02 ENCOUNTER — Ambulatory Visit (INDEPENDENT_AMBULATORY_CARE_PROVIDER_SITE_OTHER): Payer: 59 | Admitting: Family Medicine

## 2013-12-02 ENCOUNTER — Encounter: Payer: Self-pay | Admitting: Family Medicine

## 2013-12-02 VITALS — BP 118/80 | HR 82 | Temp 98.2°F | Ht 66.0 in | Wt 178.0 lb

## 2013-12-02 DIAGNOSIS — R1032 Left lower quadrant pain: Secondary | ICD-10-CM

## 2013-12-02 DIAGNOSIS — Z23 Encounter for immunization: Secondary | ICD-10-CM

## 2013-12-02 DIAGNOSIS — Z8719 Personal history of other diseases of the digestive system: Secondary | ICD-10-CM

## 2013-12-02 LAB — BASIC METABOLIC PANEL
BUN: 19 mg/dL (ref 6–23)
CO2: 26 mEq/L (ref 19–32)
Calcium: 9 mg/dL (ref 8.4–10.5)
Chloride: 105 mEq/L (ref 96–112)
Creatinine, Ser: 0.8 mg/dL (ref 0.4–1.2)
GFR: 79.95 mL/min (ref >60.00)
Glucose, Bld: 92 mg/dL (ref 70–99)
Potassium: 3.7 mEq/L (ref 3.5–5.1)
Sodium: 139 mEq/L (ref 135–145)

## 2013-12-02 LAB — CBC WITH DIFFERENTIAL/PLATELET
Basophils Absolute: 0 10*3/uL (ref 0.0–0.1)
Basophils Relative: 0.4 % (ref 0.0–3.0)
Eosinophils Absolute: 0.1 10*3/uL (ref 0.0–0.7)
Eosinophils Relative: 1.9 % (ref 0.0–5.0)
HCT: 39 % (ref 36.0–46.0)
Hemoglobin: 13 g/dL (ref 12.0–15.0)
Lymphocytes Relative: 26.1 % (ref 12.0–46.0)
Lymphs Abs: 1.3 10*3/uL (ref 0.7–4.0)
MCHC: 33.3 g/dL (ref 30.0–36.0)
MCV: 94.9 fl (ref 78.0–100.0)
Monocytes Absolute: 0.4 10*3/uL (ref 0.1–1.0)
Monocytes Relative: 8.5 % (ref 3.0–12.0)
Neutro Abs: 3.1 10*3/uL (ref 1.4–7.7)
Neutrophils Relative %: 63.1 % (ref 43.0–77.0)
Platelets: 210 10*3/uL (ref 150.0–400.0)
RBC: 4.11 Mil/uL (ref 3.87–5.11)
RDW: 13.4 % (ref 11.5–15.5)
WBC: 4.9 10*3/uL (ref 4.0–10.5)

## 2013-12-02 NOTE — Progress Notes (Signed)
Pre visit review using our clinic review tool, if applicable. No additional management support is needed unless otherwise documented below in the visit note. 

## 2013-12-02 NOTE — Patient Instructions (Signed)
-  We placed a referral for you as discussed. It usually takes about 1-2 weeks to process and schedule this referral. If you have not heard from [place] regarding this appointment in 2 weeks please contact our office.  -please go to the lab as you leave   -follow up as needed

## 2013-12-02 NOTE — Progress Notes (Signed)
No chief complaint on file.   HPI:  LLQ: -for several weeks -intermittent, mild-mod, almost daily, more at night, last for 20 minutes -has not noticed if after meals -reports BM daily, no constipation -denies: fevers, dysuria, diarrhea, blood in stools, melena, vaginal discharge -seeing Dr. Maurene Capes in GI for this in a few weeks  ROS: See pertinent positives and negatives per HPI.  Past Medical History  Diagnosis Date  . Hyperlipemia 04/12/2012  . Essential hypertension, benign 04/12/2012  . Generalized anxiety disorder 04/12/2012  . Psoriatic arthritis - managed by Dr. Amil Amen in [facility] 04/12/2012  . Osteopenia 04/12/2012  . Neurofibroma of neck   . Arthritis   . Chicken pox   . Headache(784.0)     frequent    Past Surgical History  Procedure Laterality Date  . Breast biopsy  2000  . Endometrosis    . Nero bibromo      Family History  Problem Relation Age of Onset  . Heart disease Father 73    MI  . Cancer Maternal Grandmother     cervical ca  . Heart disease Brother 59    History   Social History  . Marital Status: Married    Spouse Name: N/A    Number of Children: N/A  . Years of Education: N/A   Social History Main Topics  . Smoking status: Never Smoker   . Smokeless tobacco: None  . Alcohol Use: Yes     Comment: SOCIALLY   . Drug Use: None  . Sexual Activity: None   Other Topics Concern  . 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 parkinson's      Spiritual Beliefs: episcopalian, christian      Lifestyle:  No regular exercise, diet is decent                Current outpatient prescriptions:folic acid (FOLVITE) 1 MG tablet, Take 1 mg by mouth daily., Disp: , Rfl: ;  methotrexate (RHEUMATREX) 2.5 MG tablet, Take by mouth once a week. 4 tablets once per week, Disp: , Rfl: ;  rosuvastatin (CRESTOR) 20 MG tablet, Take 1  tablet (20 mg total) by mouth daily., Disp: 30 tablet, Rfl: 3;  sertraline (ZOLOFT) 50 MG tablet, Take 1 tablet (50 mg total) by mouth daily., Disp: 90 tablet, Rfl: 3  EXAM:  Filed Vitals:   12/02/13 0804  BP: 118/80  Pulse: 82  Temp: 98.2 F (36.8 C)    Body mass index is 28.74 kg/(m^2).  GENERAL: vitals reviewed and listed above, alert, oriented, appears well hydrated and in no acute distress  HEENT: atraumatic, conjunttiva clear, no obvious abnormalities on inspection of external nose and ears  NECK: no obvious masses on inspection  LUNGS: clear to auscultation bilaterally, no wheezes, rales or rhonchi, good air movement  ABD: BS+, soft, mild TTP LLQ difuse, no rebound or guarding  CV: HRRR, no peripheral edema  MS: moves all extremities without noticeable abnormality  PSYCH: pleasant and cooperative, no obvious depression or anxiety  ASSESSMENT AND PLAN:  Discussed the following assessment and plan:  LLQ abdominal pain - Plan: CT Abdomen Pelvis W Contrast, Basic metabolic panel, CBC with Differential  Hx of diverticulitis of colon - Plan: CT Abdomen Pelvis W Contrast  -we discussed possible serious and likely etiologies, workup and treatment, treatment risks and return precautions -  after this discussion, Savanah opted for CT abd/pelvis -follow up advised with Dr. Maurene Capes as scheduled and here as needed -of course, we advised Marwah  to return or notify a doctor immediately if symptoms worsen or persist or new concerns arise.  .  -Patient advised to return or notify a doctor immediately if symptoms worsen or persist or new concerns arise.  There are no Patient Instructions on file for this visit.   Colin Benton R.

## 2013-12-02 NOTE — Addendum Note (Signed)
Addended by: Lahoma Crocker A on: 12/02/2013 08:30 AM   Modules accepted: Orders

## 2013-12-04 ENCOUNTER — Ambulatory Visit (INDEPENDENT_AMBULATORY_CARE_PROVIDER_SITE_OTHER)
Admission: RE | Admit: 2013-12-04 | Discharge: 2013-12-04 | Disposition: A | Discharge status: Home / Self Care | Payer: 59 | Source: Ambulatory Visit | Attending: Family Medicine | Admitting: Family Medicine

## 2013-12-04 DIAGNOSIS — R1032 Left lower quadrant pain: Secondary | ICD-10-CM

## 2013-12-04 DIAGNOSIS — Z8719 Personal history of other diseases of the digestive system: Secondary | ICD-10-CM

## 2013-12-04 MED ORDER — IOHEXOL 300 MG/ML  SOLN
100.0000 mL | Freq: Once | INTRAMUSCULAR | Status: AC | PRN
  Administered 2013-12-04: 100 mL via INTRAVENOUS

## 2013-12-16 LAB — HM DEXA SCAN

## 2013-12-20 ENCOUNTER — Encounter: Payer: Self-pay | Admitting: Internal Medicine

## 2013-12-20 ENCOUNTER — Ambulatory Visit (INDEPENDENT_AMBULATORY_CARE_PROVIDER_SITE_OTHER): Payer: 59 | Admitting: Internal Medicine

## 2013-12-20 VITALS — BP 130/90 | HR 80 | Ht 65.0 in | Wt 177.2 lb

## 2013-12-20 DIAGNOSIS — K5732 Diverticulitis of large intestine without perforation or abscess without bleeding: Secondary | ICD-10-CM

## 2013-12-20 MED ORDER — DICYCLOMINE HCL 10 MG PO CAPS: 10.0000 mg | ORAL_CAPSULE | Freq: Three times a day (TID) | ORAL | Status: DC | PRN

## 2013-12-20 NOTE — Patient Instructions (Addendum)
Please purchase a probiotic over the counter (i.e florastor, align) and take once daily.  Please purchase the following medications over the counter and take as directed: benefiber 1 tablespoon daily  Please follow up with Dr Olevia Perches in January 2016.  We have sent the following medications to your pharmacy for you to pick up at your convenience: Bentyl  CC:Dr Colin Benton

## 2013-12-20 NOTE — Progress Notes (Signed)
Cassidy Dickerson 08-24-1950 [id number]  Note: This dictation was prepared with Dragon digital system. Any transcriptional errors that result from this procedure are unintentional.   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 She had a prior episode of diverticulitis 1 year ago while in [place], [place] and was evaluated in  local emergency room where a CT scan apparently showed diverticulitis and she was treated with Cipro and Flagyl with complete resolution of her symptoms. Current episode has been more severe and prolonged than the first one.  It started in August 2015 and she again took Cipro and Flagyl. There was never any fever or rectal bleeding. A CT scan of the abdomen and pelvis which was done in October  6-8 weeks following the acute episode did not show any evidence of active diverticulitis although she did have diverticulosis She has been  on a soft low residue diet. She is still on it. She was having pain while driving a car or when sitting. It is better now although she still has some discomfort with movement. The pain does not radiate to her back or to her leg. She had a colonoscopy elsewhere in 2011. We don't have the records. She was told the exam was normal. There is no family history of colon cancer. She was told to have a recall colonoscopy in 10 years.    Past Medical History  Diagnosis Date  . Hyperlipemia 04/12/2012  . Essential hypertension, benign 04/12/2012  . Generalized anxiety disorder 04/12/2012  . Psoriatic arthritis - managed by Dr. Amil Amen in [facility] 04/12/2012  . Osteopenia 04/12/2012  . Neurofibroma of neck   . Arthritis   . Chicken pox   . Headache(784.0)     frequent  . Diverticulitis     Past Surgical History  Procedure Laterality Date  . Breast biopsy Right 2000  . Endomertial surgery    .  Neurofibroma excision      of head    No Known Allergies  Family history and social history have been reviewed.  Review of Systems: Denies heartburn, nausea, vomiting. Persistent left lower quadrant discomfort, denies diarrhea or constipation  The remainder of the 10 point ROS is negative except as outlined in the H&P  Physical Exam: General Appearance Well developed, in no distress Eyes  Non icteric  HEENT  Non traumatic, normocephalic  Mouth No lesion, tongue papillated, no cheilosis Neck Supple without adenopathy, thyroid not enlarged, no carotid bruits, no JVD Lungs Clear to auscultation bilaterally COR Normal S1, normal S2, regular rhythm, no murmur, quiet precordium Abdomen relaxed. Tender in left lower and left middle quadrant without rebound. Straight leg raising and sitting up precipitate the pain. There is no CVA tenderness. Right upper and lower quadrants are unremarkable. There is no tympany or distention. Rectal small amount of soft Hemoccult negative stool. Extremities  No pedal edema Skin No lesions Neurological Alert and oriented x 3 Psychological Normal mood and affect  Assessment and Plan:   Problem #22 63 year old white female with a history of diverticulitis 1 year ago and recurrent abdominal pain 2 months ago. A CT scan of the abdomen which was done several weeks following the episode  After she completed antibiotics did not show any acute inflammatory changes but the pain in the left side still continues though to a lesser degree. I  believe we are dealing with chronic diverticular disease with resolving diverticulitis. She is Hemoccult negative. She will slowly increase fiber in her diet and take Benefiber 1 tablespoon daily. I have also asked her to start probiotics, 1 daily. We will send Bentyl 10 mg for her to take when necessary for crampy abdominal pain. I would like to see her in 6-8 weeks and if the pain persists, we will proceed with a flexible sigmoidoscopy  to assess the left colon. We have discussed the possibility of sigmoid resection depending on the findings at flexible sigmoidoscopy.      Delfin Edis 12/20/2013

## 2013-12-23 ENCOUNTER — Encounter: Payer: Self-pay | Admitting: Family Medicine

## 2013-12-27 ENCOUNTER — Encounter: Payer: Self-pay | Admitting: Family Medicine

## 2014-04-03 DIAGNOSIS — L4050 Arthropathic psoriasis, unspecified: Secondary | ICD-10-CM | POA: Diagnosis not present

## 2014-04-28 DIAGNOSIS — H5213 Myopia, bilateral: Secondary | ICD-10-CM | POA: Diagnosis not present

## 2014-04-28 DIAGNOSIS — H25043 Posterior subcapsular polar age-related cataract, bilateral: Secondary | ICD-10-CM | POA: Diagnosis not present

## 2014-06-01 DIAGNOSIS — S70362A Insect bite (nonvenomous), left thigh, initial encounter: Secondary | ICD-10-CM | POA: Diagnosis not present

## 2014-06-01 DIAGNOSIS — L089 Local infection of the skin and subcutaneous tissue, unspecified: Secondary | ICD-10-CM | POA: Diagnosis not present

## 2014-06-19 DIAGNOSIS — H25812 Combined forms of age-related cataract, left eye: Secondary | ICD-10-CM | POA: Diagnosis not present

## 2014-06-19 DIAGNOSIS — H25042 Posterior subcapsular polar age-related cataract, left eye: Secondary | ICD-10-CM | POA: Diagnosis not present

## 2014-06-21 HISTORY — PX: CATARACT EXTRACTION: SUR2

## 2014-06-24 DIAGNOSIS — Z8249 Family history of ischemic heart disease and other diseases of the circulatory system: Secondary | ICD-10-CM | POA: Diagnosis not present

## 2014-06-24 DIAGNOSIS — Z79899 Other long term (current) drug therapy: Secondary | ICD-10-CM | POA: Diagnosis not present

## 2014-06-24 DIAGNOSIS — N390 Urinary tract infection, site not specified: Secondary | ICD-10-CM | POA: Diagnosis not present

## 2014-06-24 DIAGNOSIS — I340 Nonrheumatic mitral (valve) insufficiency: Secondary | ICD-10-CM | POA: Diagnosis not present

## 2014-06-24 DIAGNOSIS — R42 Dizziness and giddiness: Secondary | ICD-10-CM | POA: Diagnosis not present

## 2014-06-24 DIAGNOSIS — E780 Pure hypercholesterolemia: Secondary | ICD-10-CM | POA: Diagnosis not present

## 2014-06-24 DIAGNOSIS — R5383 Other fatigue: Secondary | ICD-10-CM | POA: Diagnosis not present

## 2014-06-24 DIAGNOSIS — R0789 Other chest pain: Secondary | ICD-10-CM | POA: Diagnosis not present

## 2014-06-24 DIAGNOSIS — Z809 Family history of malignant neoplasm, unspecified: Secondary | ICD-10-CM | POA: Diagnosis not present

## 2014-06-24 DIAGNOSIS — Z9889 Other specified postprocedural states: Secondary | ICD-10-CM | POA: Diagnosis not present

## 2014-06-24 DIAGNOSIS — F419 Anxiety disorder, unspecified: Secondary | ICD-10-CM | POA: Diagnosis not present

## 2014-06-24 DIAGNOSIS — M1990 Unspecified osteoarthritis, unspecified site: Secondary | ICD-10-CM | POA: Diagnosis not present

## 2014-06-24 DIAGNOSIS — R0602 Shortness of breath: Secondary | ICD-10-CM | POA: Diagnosis not present

## 2014-06-24 DIAGNOSIS — R110 Nausea: Secondary | ICD-10-CM | POA: Diagnosis not present

## 2014-06-24 DIAGNOSIS — H269 Unspecified cataract: Secondary | ICD-10-CM | POA: Diagnosis not present

## 2014-06-24 DIAGNOSIS — I10 Essential (primary) hypertension: Secondary | ICD-10-CM | POA: Diagnosis not present

## 2014-06-24 DIAGNOSIS — L4050 Arthropathic psoriasis, unspecified: Secondary | ICD-10-CM | POA: Diagnosis not present

## 2014-06-24 DIAGNOSIS — E785 Hyperlipidemia, unspecified: Secondary | ICD-10-CM | POA: Diagnosis not present

## 2014-06-24 DIAGNOSIS — R61 Generalized hyperhidrosis: Secondary | ICD-10-CM | POA: Diagnosis not present

## 2014-06-24 DIAGNOSIS — R079 Chest pain, unspecified: Secondary | ICD-10-CM | POA: Diagnosis not present

## 2014-06-25 DIAGNOSIS — R079 Chest pain, unspecified: Secondary | ICD-10-CM | POA: Diagnosis not present

## 2014-06-25 DIAGNOSIS — I10 Essential (primary) hypertension: Secondary | ICD-10-CM | POA: Diagnosis not present

## 2014-06-25 DIAGNOSIS — E780 Pure hypercholesterolemia: Secondary | ICD-10-CM | POA: Diagnosis not present

## 2014-06-25 DIAGNOSIS — N390 Urinary tract infection, site not specified: Secondary | ICD-10-CM | POA: Diagnosis not present

## 2014-07-01 DIAGNOSIS — R079 Chest pain, unspecified: Secondary | ICD-10-CM | POA: Diagnosis not present

## 2014-07-03 ENCOUNTER — Encounter: Payer: Self-pay | Admitting: Family Medicine

## 2014-07-03 ENCOUNTER — Ambulatory Visit (INDEPENDENT_AMBULATORY_CARE_PROVIDER_SITE_OTHER): Payer: Medicare Other | Admitting: Family Medicine

## 2014-07-03 VITALS — BP 116/74 | HR 82 | Temp 98.8°F | Ht 65.5 in | Wt 179.8 lb

## 2014-07-03 DIAGNOSIS — J8489 Other specified interstitial pulmonary diseases: Secondary | ICD-10-CM | POA: Diagnosis not present

## 2014-07-03 DIAGNOSIS — E785 Hyperlipidemia, unspecified: Secondary | ICD-10-CM

## 2014-07-03 DIAGNOSIS — M8580 Other specified disorders of bone density and structure, unspecified site: Secondary | ICD-10-CM | POA: Diagnosis not present

## 2014-07-03 DIAGNOSIS — F411 Generalized anxiety disorder: Secondary | ICD-10-CM

## 2014-07-03 DIAGNOSIS — Z79899 Other long term (current) drug therapy: Secondary | ICD-10-CM | POA: Diagnosis not present

## 2014-07-03 DIAGNOSIS — L4050 Arthropathic psoriasis, unspecified: Secondary | ICD-10-CM | POA: Diagnosis not present

## 2014-07-03 DIAGNOSIS — I10 Essential (primary) hypertension: Secondary | ICD-10-CM | POA: Diagnosis not present

## 2014-07-03 MED ORDER — SERTRALINE HCL 50 MG PO TABS: 50.0000 mg | ORAL_TABLET | Freq: Every day | ORAL | Status: DC

## 2014-07-03 NOTE — Progress Notes (Signed)
Pre visit review using our clinic review tool, if applicable. No additional management support is needed unless otherwise documented below in the visit note. 

## 2014-07-03 NOTE — Progress Notes (Signed)
HPI:  Follow up: She has moved to [facility] and is here to say good bye:(. She will be establishing with a doctor over there but needs refills in the interim.  HLD/HTN:  -started statin 05/2012  -reports she had multiple labs recently -she recently had a cardiac work up and now is on crestor and losartan -  -reports: tolerating well  -denies: leg cramps, cognitive changes  Depression/GAD:  -on zoloft 50mg  daily  -reports: stable  -denies: depressed, thoughts of self harm   Psoriatic arthritis:  -managed by Dr. Amil Amen, Rhe[facility]   IBS/Hx diverticulitis: -sees Dr. Olevia Perches  ROS: See pertinent positives and negatives per HPI.  Past Medical History  Diagnosis Date  . Hyperlipemia 04/12/2012  . Essential hypertension, benign 04/12/2012  . Generalized anxiety disorder 04/12/2012  . Psoriatic arthritis - managed by Dr. Amil Amen in [facility] 04/12/2012  . Osteopenia 04/12/2012  . Neurofibroma of neck   . Arthritis   . Chicken pox   . Headache(784.0)     frequent  . Diverticulitis     Past Surgical History  Procedure Laterality Date  . Breast biopsy Right 2000  . Endomertial surgery    . Neurofibroma excision      of head  . Cataract extraction Left 06/21/2014    Family History  Problem Relation Age of Onset  . Heart disease Father 95    MI  . Cervical cancer Maternal Grandmother   . Heart disease Brother 83  . Osteoporosis Mother     History   Social History  . Marital Status: Married    Spouse Name: N/A  . Number of Children: 2  . Years of Education: N/A   Occupational History  . retired [employer]   Social History Main Topics  . Smoking status: Never Smoker   . Smokeless tobacco: Never Used  . Alcohol Use: 3.5 oz/week    7 drink(s) per week     Comment: SOCIALLY   . Drug Use: No  . Sexual Activity: Not on file   Other Topics Concern  . None   Social History Narrative   Work or School: part Teacher, music for [employer]  Situation: lives with husband whom is dealing with health issues - ? parkinson's      Spiritual Beliefs: episcopalian, christian      Lifestyle:  No regular exercise, diet is decent                 Current outpatient prescriptions:  Marland Kitchen  Difluprednate 0.05 % EMUL, Administer 1 drop into the left eye 4 (four) times a day., Disp: , Rfl:  .  folic acid (FOLVITE) 1 MG tablet, Take 1 mg by mouth daily., Disp: , Rfl:  .  losartan (COZAAR) 25 MG tablet, Take by mouth daily. , Disp: , Rfl:  .  methotrexate (RHEUMATREX) 2.5 MG tablet, Take by mouth once a week. 4 tablets once per week, Disp: , Rfl:  .  pantoprazole (PROTONIX) 40 MG tablet, Take by mouth daily. , Disp: , Rfl:  .  rosuvastatin (CRESTOR) 20 MG tablet, Take 1 tablet (20 mg total) by mouth daily., Disp: 30 tablet, Rfl: 3 .  sertraline (ZOLOFT) 50 MG tablet, Take 1 tablet (50 mg total) by mouth daily., Disp: 90 tablet, Rfl: 1  EXAM:  Filed Vitals:   07/03/14 1325  BP: 116/74  Pulse: 82  Temp: 98.8 F (37.1 C)    Body mass index is 29.45 kg/(m^2).  GENERAL: vitals reviewed and listed above, alert, oriented, appears well hydrated and in no acute distress  HEENT: atraumatic, conjunttiva clear, no obvious abnormalities on inspection of external nose and ears  NECK: no obvious masses on inspection  LUNGS: clear to auscultation bilaterally, no wheezes, rales or rhonchi, good air movement  CV: HRRR, no peripheral edema  MS: moves all extremities without noticeable abnormality  PSYCH: pleasant and cooperative, no obvious depression or anxiety  ASSESSMENT AND PLAN:  Discussed the following assessment and plan:  Generalized anxiety disorder - Plan: sertraline (ZOLOFT) 50 MG tablet  Hyperlipemia  Essential hypertension, benign  -doing well -cont current medications, follow up as needed - but advised to establish in [place] asap -Patient advised to return or notify a doctor immediately if symptoms worsen or persist or  new concerns arise.  There are no Patient Instructions on file for this visit.   Colin Benton R.

## 2014-07-10 DIAGNOSIS — H25041 Posterior subcapsular polar age-related cataract, right eye: Secondary | ICD-10-CM | POA: Diagnosis not present

## 2014-07-10 DIAGNOSIS — H268 Other specified cataract: Secondary | ICD-10-CM | POA: Diagnosis not present

## 2014-07-10 DIAGNOSIS — H25811 Combined forms of age-related cataract, right eye: Secondary | ICD-10-CM | POA: Diagnosis not present

## 2014-09-08 DIAGNOSIS — H10022 Other mucopurulent conjunctivitis, left eye: Secondary | ICD-10-CM | POA: Diagnosis not present

## 2014-09-24 DIAGNOSIS — Z961 Presence of intraocular lens: Secondary | ICD-10-CM | POA: Diagnosis not present

## 2014-11-11 DIAGNOSIS — Z961 Presence of intraocular lens: Secondary | ICD-10-CM | POA: Diagnosis not present

## 2015-01-13 ENCOUNTER — Other Ambulatory Visit: Payer: Self-pay | Admitting: Family Medicine

## 2015-01-13 DIAGNOSIS — F411 Generalized anxiety disorder: Secondary | ICD-10-CM

## 2015-01-13 MED ORDER — SERTRALINE HCL 50 MG PO TABS: 50.0000 mg | ORAL_TABLET | Freq: Every day | ORAL | Status: DC

## 2015-01-22 ENCOUNTER — Other Ambulatory Visit: Payer: Self-pay | Admitting: *Deleted

## 2015-01-22 DIAGNOSIS — F411 Generalized anxiety disorder: Secondary | ICD-10-CM

## 2015-01-22 MED ORDER — SERTRALINE HCL 50 MG PO TABS: 50.0000 mg | ORAL_TABLET | Freq: Every day | ORAL | Status: DC

## 2015-01-22 NOTE — Telephone Encounter (Signed)
Rx done. 

## 2015-02-25 ENCOUNTER — Other Ambulatory Visit: Payer: Self-pay | Admitting: Family Medicine

## 2015-03-28 ENCOUNTER — Other Ambulatory Visit: Payer: Self-pay | Admitting: Family Medicine

## 2015-03-31 DIAGNOSIS — X32XXXA Exposure to sunlight, initial encounter: Secondary | ICD-10-CM | POA: Diagnosis not present

## 2015-03-31 DIAGNOSIS — Z7189 Other specified counseling: Secondary | ICD-10-CM | POA: Diagnosis not present

## 2015-03-31 DIAGNOSIS — L821 Other seborrheic keratosis: Secondary | ICD-10-CM | POA: Diagnosis not present

## 2015-03-31 DIAGNOSIS — L570 Actinic keratosis: Secondary | ICD-10-CM | POA: Diagnosis not present

## 2015-04-29 ENCOUNTER — Other Ambulatory Visit: Payer: Self-pay | Admitting: Family Medicine

## 2015-05-01 DIAGNOSIS — I10 Essential (primary) hypertension: Secondary | ICD-10-CM | POA: Diagnosis not present

## 2015-05-01 DIAGNOSIS — R0683 Snoring: Secondary | ICD-10-CM | POA: Diagnosis not present

## 2015-05-05 DIAGNOSIS — R635 Abnormal weight gain: Secondary | ICD-10-CM | POA: Diagnosis not present

## 2015-05-05 DIAGNOSIS — I10 Essential (primary) hypertension: Secondary | ICD-10-CM | POA: Diagnosis not present

## 2015-05-07 DIAGNOSIS — Z1231 Encounter for screening mammogram for malignant neoplasm of breast: Secondary | ICD-10-CM | POA: Diagnosis not present

## 2015-05-22 DIAGNOSIS — L4050 Arthropathic psoriasis, unspecified: Secondary | ICD-10-CM | POA: Diagnosis not present

## 2015-05-22 DIAGNOSIS — R079 Chest pain, unspecified: Secondary | ICD-10-CM | POA: Diagnosis not present

## 2015-05-22 DIAGNOSIS — E7800 Pure hypercholesterolemia, unspecified: Secondary | ICD-10-CM | POA: Diagnosis not present

## 2015-05-22 DIAGNOSIS — I10 Essential (primary) hypertension: Secondary | ICD-10-CM | POA: Diagnosis not present

## 2015-05-26 ENCOUNTER — Other Ambulatory Visit: Payer: Self-pay | Admitting: Family Medicine

## 2015-05-26 DIAGNOSIS — M8580 Other specified disorders of bone density and structure, unspecified site: Secondary | ICD-10-CM | POA: Diagnosis not present

## 2015-05-26 DIAGNOSIS — L4050 Arthropathic psoriasis, unspecified: Secondary | ICD-10-CM | POA: Diagnosis not present

## 2015-05-26 DIAGNOSIS — Z5181 Encounter for therapeutic drug level monitoring: Secondary | ICD-10-CM | POA: Diagnosis not present

## 2015-07-15 DIAGNOSIS — L821 Other seborrheic keratosis: Secondary | ICD-10-CM | POA: Diagnosis not present

## 2015-07-15 DIAGNOSIS — L814 Other melanin hyperpigmentation: Secondary | ICD-10-CM | POA: Diagnosis not present

## 2015-07-15 DIAGNOSIS — L239 Allergic contact dermatitis, unspecified cause: Secondary | ICD-10-CM | POA: Diagnosis not present

## 2015-07-15 DIAGNOSIS — D1801 Hemangioma of skin and subcutaneous tissue: Secondary | ICD-10-CM | POA: Diagnosis not present

## 2015-07-15 DIAGNOSIS — L570 Actinic keratosis: Secondary | ICD-10-CM | POA: Diagnosis not present

## 2015-07-15 DIAGNOSIS — L820 Inflamed seborrheic keratosis: Secondary | ICD-10-CM | POA: Diagnosis not present

## 2015-07-21 DIAGNOSIS — Z13820 Encounter for screening for osteoporosis: Secondary | ICD-10-CM | POA: Diagnosis not present

## 2015-07-21 DIAGNOSIS — M810 Age-related osteoporosis without current pathological fracture: Secondary | ICD-10-CM | POA: Diagnosis not present

## 2015-07-28 DIAGNOSIS — H5712 Ocular pain, left eye: Secondary | ICD-10-CM | POA: Diagnosis not present

## 2015-07-28 DIAGNOSIS — S0502XA Injury of conjunctiva and corneal abrasion without foreign body, left eye, initial encounter: Secondary | ICD-10-CM | POA: Diagnosis not present

## 2015-07-29 DIAGNOSIS — H1032 Unspecified acute conjunctivitis, left eye: Secondary | ICD-10-CM | POA: Diagnosis not present

## 2015-08-17 DIAGNOSIS — G4733 Obstructive sleep apnea (adult) (pediatric): Secondary | ICD-10-CM | POA: Diagnosis not present

## 2015-09-01 DIAGNOSIS — Z5181 Encounter for therapeutic drug level monitoring: Secondary | ICD-10-CM | POA: Diagnosis not present

## 2015-09-01 DIAGNOSIS — L4050 Arthropathic psoriasis, unspecified: Secondary | ICD-10-CM | POA: Diagnosis not present

## 2015-09-01 DIAGNOSIS — M8580 Other specified disorders of bone density and structure, unspecified site: Secondary | ICD-10-CM | POA: Diagnosis not present

## 2015-09-06 IMAGING — CT CT ABD-PELV W/ CM
2 of 5 series · 17 of 46 positions shown, 19 images · IV contrast (omnipaque)
Comparison: None.

CLINICAL DATA: Left lower quadrant pain, diverticulitis

EXAM:
CT ABDOMEN AND PELVIS WITH CONTRAST
TECHNIQUE: Multidetector CT imaging of the abdomen and pelvis was performed
using the standard protocol following bolus administration of
intravenous contrast.
CONTRAST:  100mL OMNIPAQUE IOHEXOL 300 MG/ML  SOLN

[Series 2: abd/ pel 5mm · axial · 0.74mm/px · z∈[-474,-78]mm · 14 of 89 slices shown, 16 images]
[im 5/89  soft-tissue]
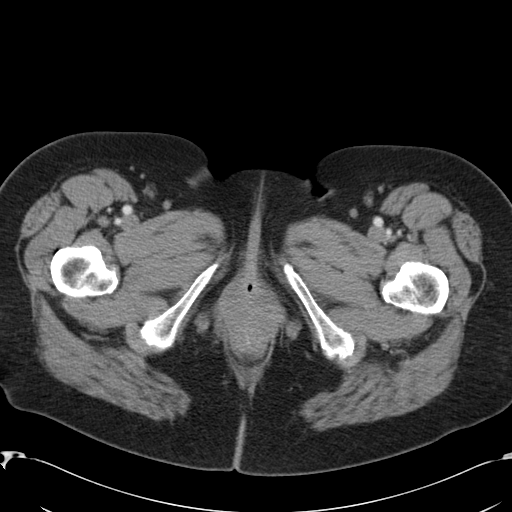
[im 5/89  bone]
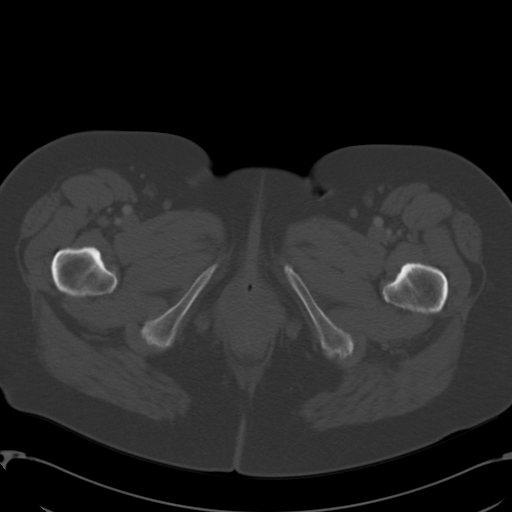
[im 14/89  soft-tissue]
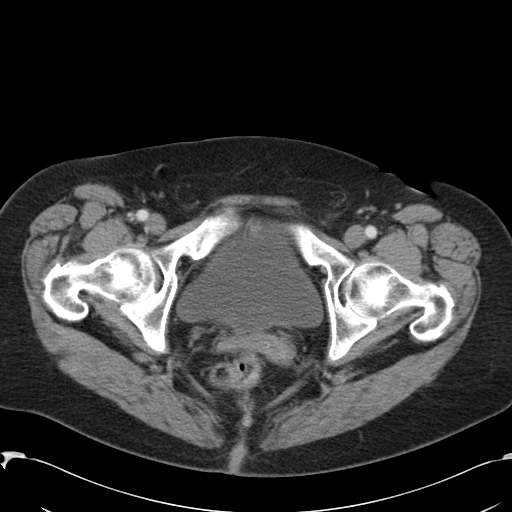
[im 18/89  soft-tissue]
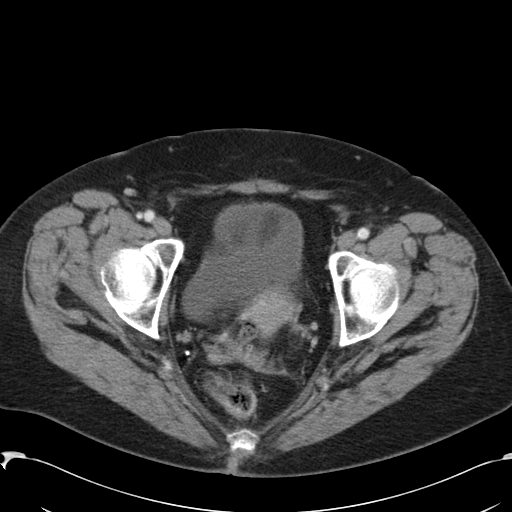
[im 23/89  soft-tissue]
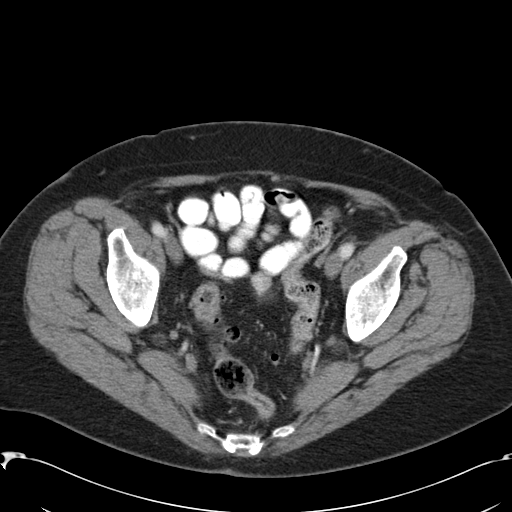
[im 31/89  soft-tissue]
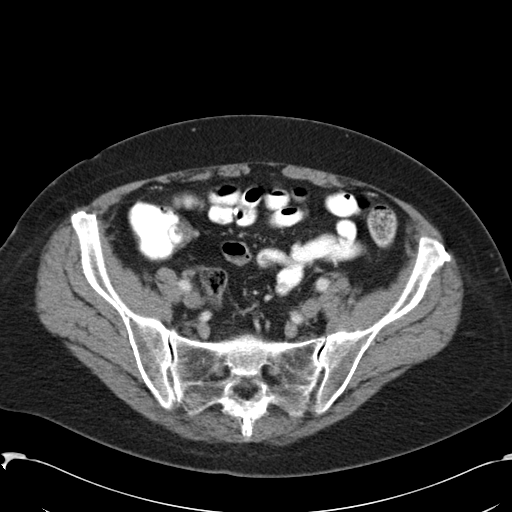
[im 36/89  soft-tissue]
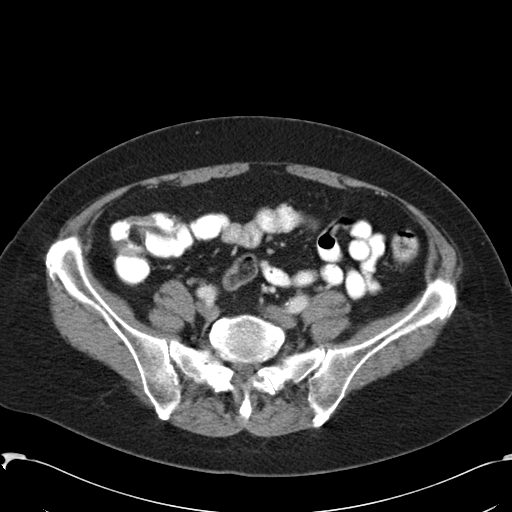
[im 40/89  soft-tissue]
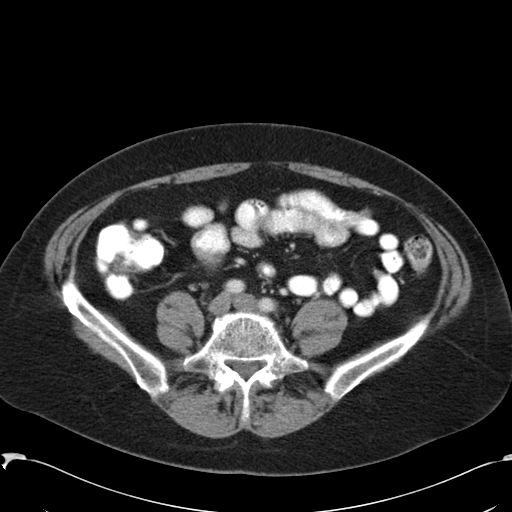
[im 49/89  soft-tissue]
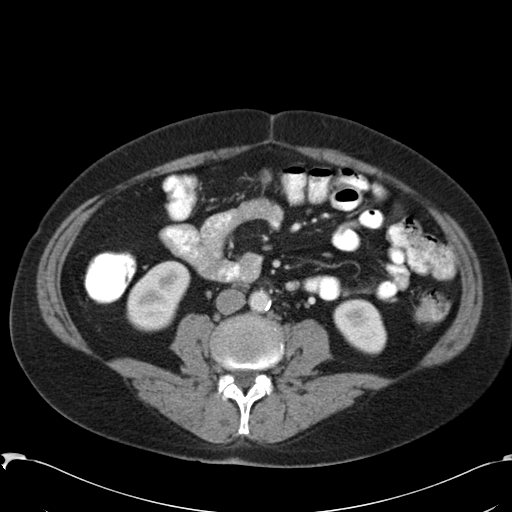
[im 53/89  soft-tissue]
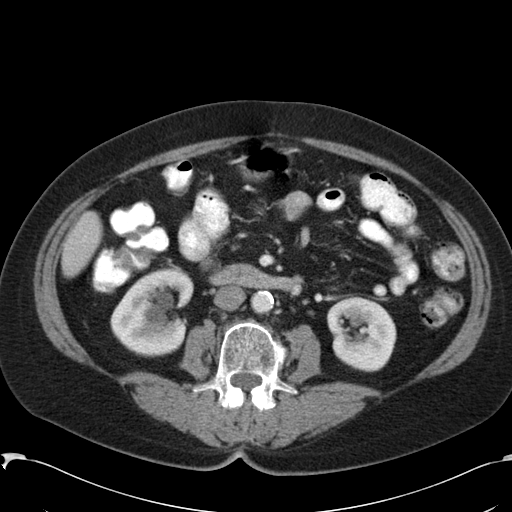
[im 53/89  bone]
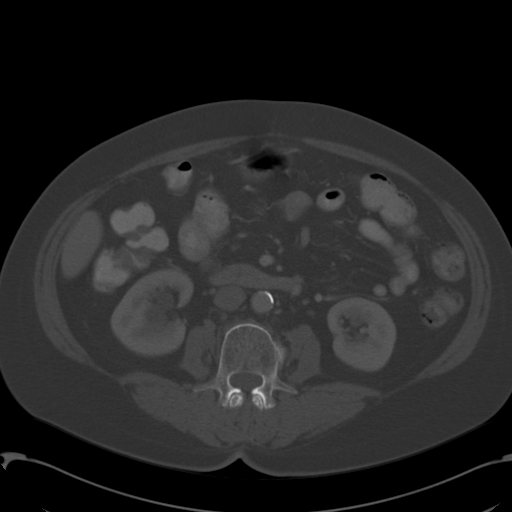
[im 58/89  soft-tissue]
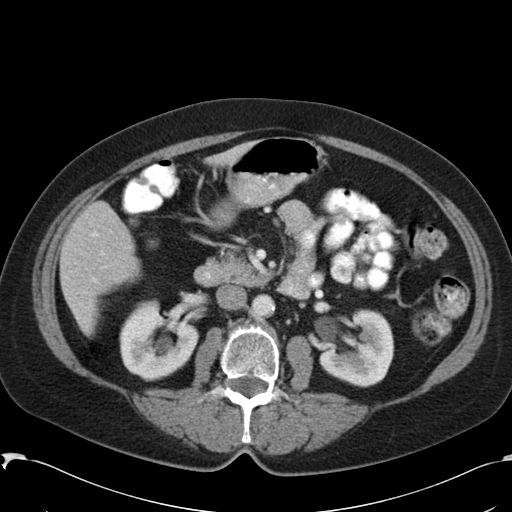
[im 67/89  soft-tissue]
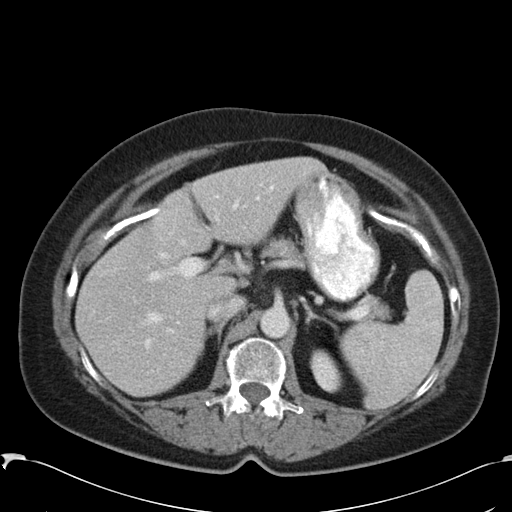
[im 71/89  soft-tissue]
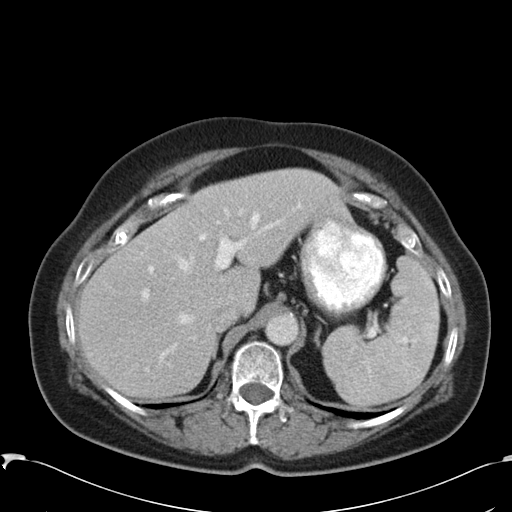
[im 75/89  soft-tissue]
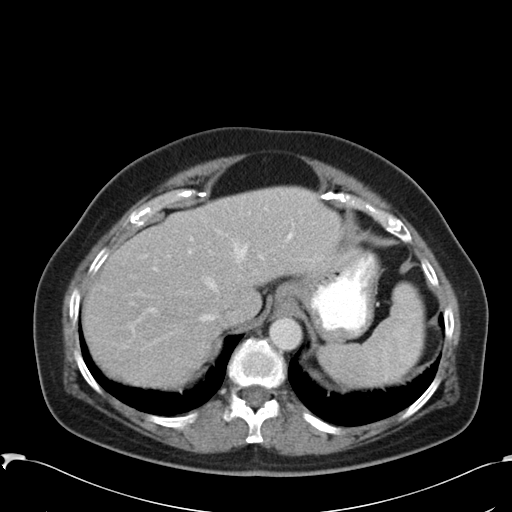
[im 84/89  soft-tissue]
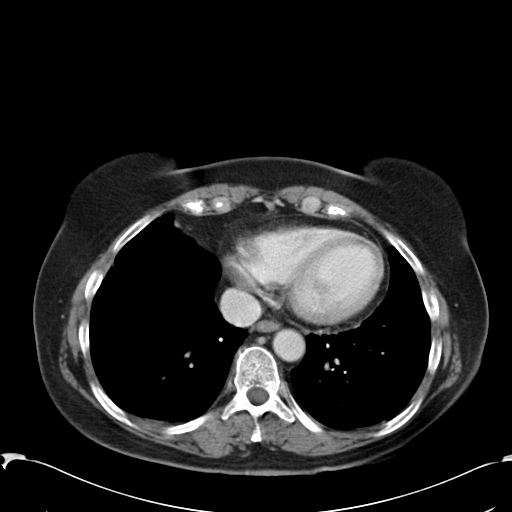

[Series 602: cor · coronal · 0.89mm/px · 3 of 109 slices shown]
[im 37/109  soft-tissue]
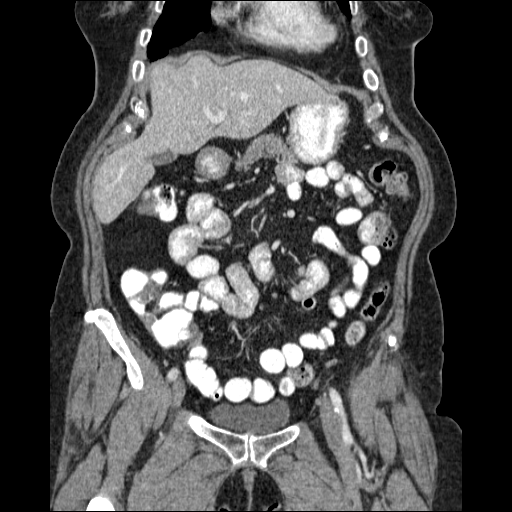
[im 49/109  soft-tissue]
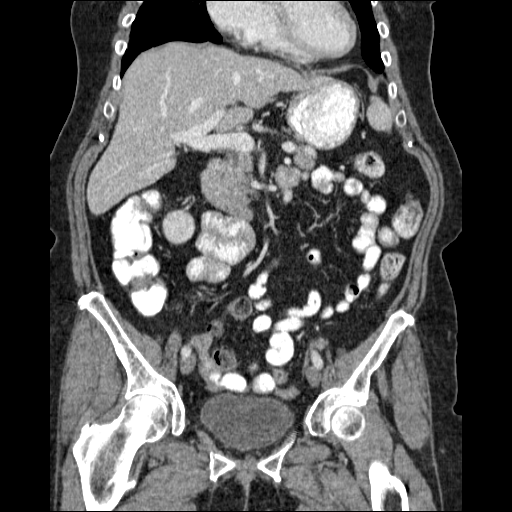
[im 61/109  soft-tissue]
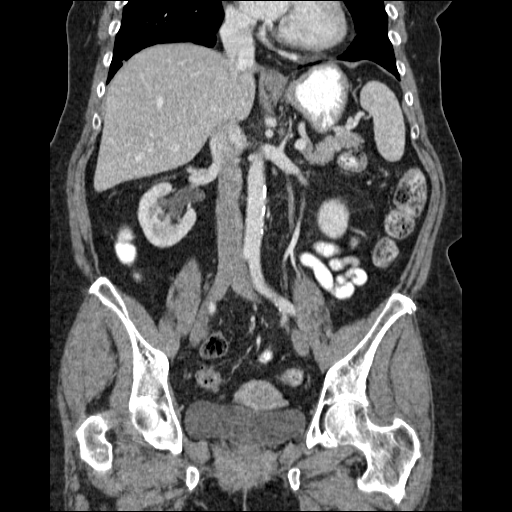

[17 of 46 positions shown; findings below may reference images not displayed]

FINDINGS: The lung bases are clear.

The liver demonstrates no focal abnormality. There is no
intrahepatic or extrahepatic biliary ductal dilatation. The
gallbladder is normal. The spleen demonstrates no focal abnormality.
The kidneys, adrenal glands and pancreas are normal. The bladder is
unremarkable.

The stomach, duodenum, small intestine, and large intestine
demonstrate no contrast extravasation or dilatation. There is a
normal caliber appendix in the right lower quadrant without
periappendiceal inflammatory changes.There is diverticulosis without
evidence of diverticulitis. There is no pneumoperitoneum,
pneumatosis, or portal venous gas. There is no abdominal or pelvic
free fluid. There is no lymphadenopathy.

The abdominal aorta is normal in caliber .

There are no lytic or sclerotic osseous lesions.
IMPRESSION: 1. Diverticulosis without evidence of diverticulitis.

## 2015-10-21 DIAGNOSIS — G4733 Obstructive sleep apnea (adult) (pediatric): Secondary | ICD-10-CM | POA: Diagnosis not present

## 2015-12-01 DIAGNOSIS — G4733 Obstructive sleep apnea (adult) (pediatric): Secondary | ICD-10-CM | POA: Diagnosis not present

## 2015-12-02 DIAGNOSIS — L4050 Arthropathic psoriasis, unspecified: Secondary | ICD-10-CM | POA: Diagnosis not present

## 2015-12-25 DIAGNOSIS — G4733 Obstructive sleep apnea (adult) (pediatric): Secondary | ICD-10-CM | POA: Diagnosis not present

## 2016-01-28 DIAGNOSIS — G4733 Obstructive sleep apnea (adult) (pediatric): Secondary | ICD-10-CM | POA: Diagnosis not present

## 2016-02-28 DIAGNOSIS — G4733 Obstructive sleep apnea (adult) (pediatric): Secondary | ICD-10-CM | POA: Diagnosis not present

## 2016-03-25 DIAGNOSIS — G4733 Obstructive sleep apnea (adult) (pediatric): Secondary | ICD-10-CM | POA: Diagnosis not present

## 2016-03-30 DIAGNOSIS — G4733 Obstructive sleep apnea (adult) (pediatric): Secondary | ICD-10-CM | POA: Diagnosis not present

## 2016-04-15 DIAGNOSIS — L4050 Arthropathic psoriasis, unspecified: Secondary | ICD-10-CM | POA: Diagnosis not present

## 2016-04-27 DIAGNOSIS — G4733 Obstructive sleep apnea (adult) (pediatric): Secondary | ICD-10-CM | POA: Diagnosis not present

## 2016-04-28 DIAGNOSIS — L4050 Arthropathic psoriasis, unspecified: Secondary | ICD-10-CM | POA: Diagnosis not present

## 2016-05-05 DIAGNOSIS — H01009 Unspecified blepharitis unspecified eye, unspecified eyelid: Secondary | ICD-10-CM | POA: Diagnosis not present

## 2016-05-28 DIAGNOSIS — G4733 Obstructive sleep apnea (adult) (pediatric): Secondary | ICD-10-CM | POA: Diagnosis not present

## 2016-06-20 DIAGNOSIS — R079 Chest pain, unspecified: Secondary | ICD-10-CM | POA: Diagnosis not present

## 2016-06-20 DIAGNOSIS — L4050 Arthropathic psoriasis, unspecified: Secondary | ICD-10-CM | POA: Diagnosis not present

## 2016-06-20 DIAGNOSIS — R002 Palpitations: Secondary | ICD-10-CM | POA: Diagnosis not present

## 2016-06-20 DIAGNOSIS — E7800 Pure hypercholesterolemia, unspecified: Secondary | ICD-10-CM | POA: Diagnosis not present

## 2016-06-20 DIAGNOSIS — I10 Essential (primary) hypertension: Secondary | ICD-10-CM | POA: Diagnosis not present

## 2016-06-27 DIAGNOSIS — G4733 Obstructive sleep apnea (adult) (pediatric): Secondary | ICD-10-CM | POA: Diagnosis not present

## 2016-07-15 DIAGNOSIS — I10 Essential (primary) hypertension: Secondary | ICD-10-CM | POA: Diagnosis not present

## 2016-07-15 DIAGNOSIS — R05 Cough: Secondary | ICD-10-CM | POA: Diagnosis not present

## 2016-07-27 DIAGNOSIS — G4733 Obstructive sleep apnea (adult) (pediatric): Secondary | ICD-10-CM | POA: Diagnosis not present

## 2016-07-28 DIAGNOSIS — R05 Cough: Secondary | ICD-10-CM | POA: Diagnosis not present

## 2016-07-28 DIAGNOSIS — G4733 Obstructive sleep apnea (adult) (pediatric): Secondary | ICD-10-CM | POA: Diagnosis not present

## 2016-08-19 DIAGNOSIS — L4050 Arthropathic psoriasis, unspecified: Secondary | ICD-10-CM | POA: Diagnosis not present

## 2016-08-27 DIAGNOSIS — G4733 Obstructive sleep apnea (adult) (pediatric): Secondary | ICD-10-CM | POA: Diagnosis not present

## 2016-09-27 DIAGNOSIS — G4733 Obstructive sleep apnea (adult) (pediatric): Secondary | ICD-10-CM | POA: Diagnosis not present

## 2016-10-28 DIAGNOSIS — G4733 Obstructive sleep apnea (adult) (pediatric): Secondary | ICD-10-CM | POA: Diagnosis not present

## 2016-11-10 ENCOUNTER — Encounter: Payer: Self-pay | Admitting: Family Medicine

## 2016-12-11 DIAGNOSIS — H109 Unspecified conjunctivitis: Secondary | ICD-10-CM | POA: Diagnosis not present

## 2016-12-23 DIAGNOSIS — J020 Streptococcal pharyngitis: Secondary | ICD-10-CM | POA: Diagnosis not present

## 2016-12-23 DIAGNOSIS — R070 Pain in throat: Secondary | ICD-10-CM | POA: Diagnosis not present

## 2016-12-25 DIAGNOSIS — R05 Cough: Secondary | ICD-10-CM | POA: Diagnosis not present

## 2016-12-25 DIAGNOSIS — A493 Mycoplasma infection, unspecified site: Secondary | ICD-10-CM | POA: Diagnosis not present

## 2016-12-25 DIAGNOSIS — R062 Wheezing: Secondary | ICD-10-CM | POA: Diagnosis not present

## 2017-01-27 DIAGNOSIS — G4733 Obstructive sleep apnea (adult) (pediatric): Secondary | ICD-10-CM | POA: Diagnosis not present

## 2017-03-02 ENCOUNTER — Encounter: Payer: Self-pay | Admitting: Family Medicine

## 2017-03-14 DIAGNOSIS — L4050 Arthropathic psoriasis, unspecified: Secondary | ICD-10-CM | POA: Diagnosis not present

## 2017-03-14 DIAGNOSIS — I10 Essential (primary) hypertension: Secondary | ICD-10-CM | POA: Diagnosis not present

## 2017-03-20 DIAGNOSIS — I10 Essential (primary) hypertension: Secondary | ICD-10-CM | POA: Diagnosis not present

## 2017-03-20 DIAGNOSIS — L4050 Arthropathic psoriasis, unspecified: Secondary | ICD-10-CM | POA: Diagnosis not present

## 2017-04-10 DIAGNOSIS — G4733 Obstructive sleep apnea (adult) (pediatric): Secondary | ICD-10-CM | POA: Diagnosis not present

## 2017-05-03 DIAGNOSIS — L4050 Arthropathic psoriasis, unspecified: Secondary | ICD-10-CM | POA: Diagnosis not present

## 2017-05-03 DIAGNOSIS — Z1389 Encounter for screening for other disorder: Secondary | ICD-10-CM | POA: Diagnosis not present

## 2017-05-03 DIAGNOSIS — R7301 Impaired fasting glucose: Secondary | ICD-10-CM | POA: Diagnosis not present

## 2017-06-18 DIAGNOSIS — J069 Acute upper respiratory infection, unspecified: Secondary | ICD-10-CM | POA: Diagnosis not present

## 2017-06-18 DIAGNOSIS — H1032 Unspecified acute conjunctivitis, left eye: Secondary | ICD-10-CM | POA: Diagnosis not present

## 2017-06-18 DIAGNOSIS — B9789 Other viral agents as the cause of diseases classified elsewhere: Secondary | ICD-10-CM | POA: Diagnosis not present

## 2017-07-21 DIAGNOSIS — L821 Other seborrheic keratosis: Secondary | ICD-10-CM | POA: Diagnosis not present

## 2017-07-21 DIAGNOSIS — D485 Neoplasm of uncertain behavior of skin: Secondary | ICD-10-CM | POA: Diagnosis not present

## 2017-07-21 DIAGNOSIS — L570 Actinic keratosis: Secondary | ICD-10-CM | POA: Diagnosis not present

## 2017-07-24 DIAGNOSIS — R7301 Impaired fasting glucose: Secondary | ICD-10-CM | POA: Diagnosis not present

## 2017-07-24 DIAGNOSIS — I10 Essential (primary) hypertension: Secondary | ICD-10-CM | POA: Diagnosis not present

## 2017-08-30 DIAGNOSIS — R002 Palpitations: Secondary | ICD-10-CM | POA: Diagnosis not present

## 2017-08-30 DIAGNOSIS — E7800 Pure hypercholesterolemia, unspecified: Secondary | ICD-10-CM | POA: Diagnosis not present

## 2017-08-30 DIAGNOSIS — I10 Essential (primary) hypertension: Secondary | ICD-10-CM | POA: Diagnosis not present

## 2017-08-30 DIAGNOSIS — G4733 Obstructive sleep apnea (adult) (pediatric): Secondary | ICD-10-CM | POA: Diagnosis not present

## 2017-08-30 DIAGNOSIS — L4050 Arthropathic psoriasis, unspecified: Secondary | ICD-10-CM | POA: Diagnosis not present

## 2017-10-09 DIAGNOSIS — Z79899 Other long term (current) drug therapy: Secondary | ICD-10-CM | POA: Diagnosis not present

## 2017-10-09 DIAGNOSIS — L4050 Arthropathic psoriasis, unspecified: Secondary | ICD-10-CM | POA: Diagnosis not present

## 2017-10-12 DIAGNOSIS — Z0389 Encounter for observation for other suspected diseases and conditions ruled out: Secondary | ICD-10-CM | POA: Diagnosis not present

## 2017-10-30 DIAGNOSIS — R05 Cough: Secondary | ICD-10-CM | POA: Diagnosis not present

## 2017-10-30 DIAGNOSIS — R350 Frequency of micturition: Secondary | ICD-10-CM | POA: Diagnosis not present

## 2017-10-30 DIAGNOSIS — N390 Urinary tract infection, site not specified: Secondary | ICD-10-CM | POA: Diagnosis not present

## 2017-11-22 DIAGNOSIS — R933 Abnormal findings on diagnostic imaging of other parts of digestive tract: Secondary | ICD-10-CM | POA: Diagnosis not present

## 2017-11-22 DIAGNOSIS — R1032 Left lower quadrant pain: Secondary | ICD-10-CM | POA: Diagnosis not present

## 2017-12-01 DIAGNOSIS — K5730 Diverticulosis of large intestine without perforation or abscess without bleeding: Secondary | ICD-10-CM | POA: Diagnosis not present

## 2017-12-01 DIAGNOSIS — D123 Benign neoplasm of transverse colon: Secondary | ICD-10-CM | POA: Diagnosis not present

## 2017-12-01 DIAGNOSIS — R933 Abnormal findings on diagnostic imaging of other parts of digestive tract: Secondary | ICD-10-CM | POA: Diagnosis not present

## 2017-12-01 DIAGNOSIS — K635 Polyp of colon: Secondary | ICD-10-CM | POA: Diagnosis not present

## 2018-01-08 ENCOUNTER — Other Ambulatory Visit: Payer: Self-pay

## 2018-01-08 NOTE — Patient Outreach (Signed)
[facility] [facility]) Care Management  01/08/2018  CHARLETHA DALPE [birth year] [id number]   Medication Adherence call to Mrs. Shauntavia Brackin patient's telephone number is disconnected patient is due on Losartan 25 mg under [place].   [facility] Management Direct Dial [phone]  Fax [phone] [email or web address]

## 2018-01-12 DIAGNOSIS — X32XXXA Exposure to sunlight, initial encounter: Secondary | ICD-10-CM | POA: Diagnosis not present

## 2018-01-12 DIAGNOSIS — Z872 Personal history of diseases of the skin and subcutaneous tissue: Secondary | ICD-10-CM | POA: Diagnosis not present

## 2018-01-12 DIAGNOSIS — D225 Melanocytic nevi of trunk: Secondary | ICD-10-CM | POA: Diagnosis not present

## 2018-01-12 DIAGNOSIS — L570 Actinic keratosis: Secondary | ICD-10-CM | POA: Diagnosis not present

## 2018-01-12 DIAGNOSIS — L814 Other melanin hyperpigmentation: Secondary | ICD-10-CM | POA: Diagnosis not present

## 2018-03-01 DIAGNOSIS — I10 Essential (primary) hypertension: Secondary | ICD-10-CM | POA: Diagnosis not present

## 2018-03-08 DIAGNOSIS — G4733 Obstructive sleep apnea (adult) (pediatric): Secondary | ICD-10-CM | POA: Diagnosis not present

## 2018-04-10 DIAGNOSIS — Z79899 Other long term (current) drug therapy: Secondary | ICD-10-CM | POA: Diagnosis not present

## 2018-04-10 DIAGNOSIS — M8589 Other specified disorders of bone density and structure, multiple sites: Secondary | ICD-10-CM | POA: Diagnosis not present

## 2018-04-10 DIAGNOSIS — L4050 Arthropathic psoriasis, unspecified: Secondary | ICD-10-CM | POA: Diagnosis not present

## 2018-04-16 DIAGNOSIS — R079 Chest pain, unspecified: Secondary | ICD-10-CM | POA: Diagnosis not present

## 2018-04-16 DIAGNOSIS — M8589 Other specified disorders of bone density and structure, multiple sites: Secondary | ICD-10-CM | POA: Diagnosis not present

## 2018-04-16 DIAGNOSIS — Z13820 Encounter for screening for osteoporosis: Secondary | ICD-10-CM | POA: Diagnosis not present

## 2018-07-09 DIAGNOSIS — M7581 Other shoulder lesions, right shoulder: Secondary | ICD-10-CM | POA: Diagnosis not present

## 2018-07-09 DIAGNOSIS — M8589 Other specified disorders of bone density and structure, multiple sites: Secondary | ICD-10-CM | POA: Diagnosis not present

## 2018-07-09 DIAGNOSIS — Z79899 Other long term (current) drug therapy: Secondary | ICD-10-CM | POA: Diagnosis not present

## 2018-07-09 DIAGNOSIS — L4050 Arthropathic psoriasis, unspecified: Secondary | ICD-10-CM | POA: Diagnosis not present

## 2018-07-11 DIAGNOSIS — Z789 Other specified health status: Secondary | ICD-10-CM | POA: Diagnosis not present

## 2018-07-11 DIAGNOSIS — M7581 Other shoulder lesions, right shoulder: Secondary | ICD-10-CM | POA: Diagnosis not present

## 2018-07-11 DIAGNOSIS — M8589 Other specified disorders of bone density and structure, multiple sites: Secondary | ICD-10-CM | POA: Diagnosis not present

## 2018-07-11 DIAGNOSIS — M2560 Stiffness of unspecified joint, not elsewhere classified: Secondary | ICD-10-CM | POA: Diagnosis not present

## 2018-07-11 DIAGNOSIS — M25511 Pain in right shoulder: Secondary | ICD-10-CM | POA: Diagnosis not present

## 2018-07-11 DIAGNOSIS — M6281 Muscle weakness (generalized): Secondary | ICD-10-CM | POA: Diagnosis not present

## 2018-07-11 DIAGNOSIS — R293 Abnormal posture: Secondary | ICD-10-CM | POA: Diagnosis not present

## 2018-07-18 DIAGNOSIS — M7581 Other shoulder lesions, right shoulder: Secondary | ICD-10-CM | POA: Diagnosis not present

## 2018-07-18 DIAGNOSIS — M2560 Stiffness of unspecified joint, not elsewhere classified: Secondary | ICD-10-CM | POA: Diagnosis not present

## 2018-07-18 DIAGNOSIS — M25511 Pain in right shoulder: Secondary | ICD-10-CM | POA: Diagnosis not present

## 2018-07-18 DIAGNOSIS — R293 Abnormal posture: Secondary | ICD-10-CM | POA: Diagnosis not present

## 2018-07-18 DIAGNOSIS — M6281 Muscle weakness (generalized): Secondary | ICD-10-CM | POA: Diagnosis not present

## 2018-07-18 DIAGNOSIS — M8589 Other specified disorders of bone density and structure, multiple sites: Secondary | ICD-10-CM | POA: Diagnosis not present

## 2018-07-18 DIAGNOSIS — Z789 Other specified health status: Secondary | ICD-10-CM | POA: Diagnosis not present

## 2018-07-23 DIAGNOSIS — M2560 Stiffness of unspecified joint, not elsewhere classified: Secondary | ICD-10-CM | POA: Diagnosis not present

## 2018-07-23 DIAGNOSIS — M6281 Muscle weakness (generalized): Secondary | ICD-10-CM | POA: Diagnosis not present

## 2018-07-23 DIAGNOSIS — Z789 Other specified health status: Secondary | ICD-10-CM | POA: Diagnosis not present

## 2018-07-23 DIAGNOSIS — M7581 Other shoulder lesions, right shoulder: Secondary | ICD-10-CM | POA: Diagnosis not present

## 2018-07-23 DIAGNOSIS — M8589 Other specified disorders of bone density and structure, multiple sites: Secondary | ICD-10-CM | POA: Diagnosis not present

## 2018-07-23 DIAGNOSIS — M25511 Pain in right shoulder: Secondary | ICD-10-CM | POA: Diagnosis not present

## 2018-07-23 DIAGNOSIS — R293 Abnormal posture: Secondary | ICD-10-CM | POA: Diagnosis not present

## 2018-08-01 DIAGNOSIS — M25511 Pain in right shoulder: Secondary | ICD-10-CM | POA: Diagnosis not present

## 2018-08-01 DIAGNOSIS — M8589 Other specified disorders of bone density and structure, multiple sites: Secondary | ICD-10-CM | POA: Diagnosis not present

## 2018-08-01 DIAGNOSIS — M7581 Other shoulder lesions, right shoulder: Secondary | ICD-10-CM | POA: Diagnosis not present

## 2018-08-01 DIAGNOSIS — M6281 Muscle weakness (generalized): Secondary | ICD-10-CM | POA: Diagnosis not present

## 2018-08-01 DIAGNOSIS — Z789 Other specified health status: Secondary | ICD-10-CM | POA: Diagnosis not present

## 2018-08-01 DIAGNOSIS — R293 Abnormal posture: Secondary | ICD-10-CM | POA: Diagnosis not present

## 2018-08-01 DIAGNOSIS — M2560 Stiffness of unspecified joint, not elsewhere classified: Secondary | ICD-10-CM | POA: Diagnosis not present

## 2018-08-09 DIAGNOSIS — N390 Urinary tract infection, site not specified: Secondary | ICD-10-CM | POA: Diagnosis not present

## 2018-08-09 DIAGNOSIS — R300 Dysuria: Secondary | ICD-10-CM | POA: Diagnosis not present

## 2018-10-16 DIAGNOSIS — L4050 Arthropathic psoriasis, unspecified: Secondary | ICD-10-CM | POA: Diagnosis not present

## 2018-10-16 DIAGNOSIS — Z79899 Other long term (current) drug therapy: Secondary | ICD-10-CM | POA: Diagnosis not present

## 2018-10-24 DIAGNOSIS — M25511 Pain in right shoulder: Secondary | ICD-10-CM | POA: Diagnosis not present

## 2018-10-24 DIAGNOSIS — M7581 Other shoulder lesions, right shoulder: Secondary | ICD-10-CM | POA: Diagnosis not present

## 2018-11-12 DIAGNOSIS — N390 Urinary tract infection, site not specified: Secondary | ICD-10-CM | POA: Diagnosis not present

## 2018-11-12 DIAGNOSIS — R300 Dysuria: Secondary | ICD-10-CM | POA: Diagnosis not present

## 2018-11-22 DIAGNOSIS — N390 Urinary tract infection, site not specified: Secondary | ICD-10-CM | POA: Diagnosis not present

## 2018-11-23 DIAGNOSIS — K5732 Diverticulitis of large intestine without perforation or abscess without bleeding: Secondary | ICD-10-CM | POA: Diagnosis not present

## 2018-11-23 DIAGNOSIS — L4050 Arthropathic psoriasis, unspecified: Secondary | ICD-10-CM | POA: Diagnosis not present

## 2018-11-23 DIAGNOSIS — R1032 Left lower quadrant pain: Secondary | ICD-10-CM | POA: Diagnosis not present

## 2018-11-23 DIAGNOSIS — R011 Cardiac murmur, unspecified: Secondary | ICD-10-CM | POA: Diagnosis not present

## 2018-12-12 ENCOUNTER — Other Ambulatory Visit: Payer: Self-pay

## 2018-12-12 NOTE — Patient Outreach (Signed)
[facility]) Care Management  12/12/2018  [name] [birth year] [id number]   Medication Adherence call to Cassidy Dickerson patient has a disconnected number.patient is showing past due on Rosuvastatin 20 mg under [place].   [employer] Management Direct Dial [phone]  Fax [phone] [email or web address]

## 2018-12-18 DIAGNOSIS — R0600 Dyspnea, unspecified: Secondary | ICD-10-CM | POA: Diagnosis not present

## 2018-12-18 DIAGNOSIS — R002 Palpitations: Secondary | ICD-10-CM | POA: Diagnosis not present

## 2018-12-18 DIAGNOSIS — L4050 Arthropathic psoriasis, unspecified: Secondary | ICD-10-CM | POA: Diagnosis not present

## 2018-12-18 DIAGNOSIS — E7800 Pure hypercholesterolemia, unspecified: Secondary | ICD-10-CM | POA: Diagnosis not present

## 2018-12-18 DIAGNOSIS — I10 Essential (primary) hypertension: Secondary | ICD-10-CM | POA: Diagnosis not present

## 2018-12-27 ENCOUNTER — Other Ambulatory Visit: Payer: Self-pay

## 2018-12-27 NOTE — Patient Outreach (Signed)
[place] [facility]) Care Management  12/27/2018  SRISHA CARRION [birth year] [id number]   Medication Adherence call to Chauncey Mann  Patient has a disconnected number and the work number they said patient does not work there any more,patient is showing past due on Rosuvastatin 20 mg under [place].   [facility] Management Direct Dial [phone]  Fax [phone] [email or web address]

## 2019-01-01 DIAGNOSIS — I340 Nonrheumatic mitral (valve) insufficiency: Secondary | ICD-10-CM | POA: Diagnosis not present

## 2019-01-15 DIAGNOSIS — D2272 Melanocytic nevi of left lower limb, including hip: Secondary | ICD-10-CM | POA: Diagnosis not present

## 2019-01-15 DIAGNOSIS — D485 Neoplasm of uncertain behavior of skin: Secondary | ICD-10-CM | POA: Diagnosis not present

## 2019-01-15 DIAGNOSIS — L853 Xerosis cutis: Secondary | ICD-10-CM | POA: Diagnosis not present

## 2019-01-15 DIAGNOSIS — C44519 Basal cell carcinoma of skin of other part of trunk: Secondary | ICD-10-CM | POA: Diagnosis not present

## 2019-01-15 DIAGNOSIS — L4059 Other psoriatic arthropathy: Secondary | ICD-10-CM | POA: Diagnosis not present

## 2019-01-15 DIAGNOSIS — L400 Psoriasis vulgaris: Secondary | ICD-10-CM | POA: Diagnosis not present

## 2019-01-15 DIAGNOSIS — D225 Melanocytic nevi of trunk: Secondary | ICD-10-CM | POA: Diagnosis not present

## 2019-01-15 DIAGNOSIS — R1032 Left lower quadrant pain: Secondary | ICD-10-CM | POA: Diagnosis not present

## 2019-01-25 DIAGNOSIS — Z124 Encounter for screening for malignant neoplasm of cervix: Secondary | ICD-10-CM | POA: Diagnosis not present

## 2019-01-25 DIAGNOSIS — N644 Mastodynia: Secondary | ICD-10-CM | POA: Diagnosis not present

## 2019-01-31 DIAGNOSIS — R1032 Left lower quadrant pain: Secondary | ICD-10-CM | POA: Diagnosis not present

## 2019-01-31 DIAGNOSIS — K760 Fatty (change of) liver, not elsewhere classified: Secondary | ICD-10-CM | POA: Diagnosis not present

## 2019-02-11 DIAGNOSIS — R1032 Left lower quadrant pain: Secondary | ICD-10-CM | POA: Diagnosis not present

## 2019-03-13 ENCOUNTER — Ambulatory Visit: Payer: Medicare Other | Attending: Internal Medicine

## 2019-03-13 DIAGNOSIS — Z23 Encounter for immunization: Secondary | ICD-10-CM | POA: Insufficient documentation

## 2019-03-13 NOTE — Progress Notes (Signed)
   Covid-19 Vaccination Clinic  Name:  Cassidy Dickerson    MRN: [id number] DOB: 1950/08/16  03/13/2019  Cassidy Dickerson was observed post Covid-19 immunization for 15 minutes without incidence. She was provided with Vaccine Information Sheet and instruction to access the V-Safe system.   Cassidy Dickerson was instructed to call 911 with any severe reactions post vaccine: Marland Kitchen Difficulty breathing  . Swelling of your face and throat  . A fast heartbeat  . A bad rash all over your body  . Dizziness and weakness    Immunizations Administered    Name Date Dose VIS Date Route   Pfizer COVID-19 Vaccine 03/13/2019  9:11 AM 0.3 mL 02/01/2019 Intramuscular   Manufacturer: [employer], [employer]   Lot: [id number]   [place]: [id number]

## 2019-03-31 ENCOUNTER — Ambulatory Visit: Payer: Medicare Other | Attending: Internal Medicine

## 2019-03-31 DIAGNOSIS — Z23 Encounter for immunization: Secondary | ICD-10-CM | POA: Insufficient documentation

## 2019-03-31 NOTE — Progress Notes (Signed)
   Covid-19 Vaccination Clinic  Name:  Cassidy Dickerson    MRN: [id number] DOB: 11/22/1950  03/31/2019  [name] was observed post Covid-19 immunization for 15 minutes without incidence. She was provided with Vaccine Information Sheet and instruction to access the V-Safe system.   [name] was instructed to call 911 with any severe reactions post vaccine: Marland Kitchen Difficulty breathing  . Swelling of your face and throat  . A fast heartbeat  . A bad rash all over your body  . Dizziness and weakness    Immunizations Administered    Name Date Dose VIS Date Route   Pfizer COVID-19 Vaccine 03/31/2019 12:08 PM 0.3 mL 02/01/2019 Intramuscular   Manufacturer: [place]   Lot: [id number]   [place]: [id number]
